# Patient Record
Sex: Male | Born: 1945 | Race: White | Hispanic: No | Marital: Single | Smoking: Never smoker
Health system: Southern US, Community
[De-identification: ages and names within clinical notes are randomized; demographics above are authoritative.]

## PROBLEM LIST (undated history)

## (undated) DIAGNOSIS — R6250 Unspecified lack of expected normal physiological development in childhood: Secondary | ICD-10-CM

## (undated) DIAGNOSIS — K219 Gastro-esophageal reflux disease without esophagitis: Secondary | ICD-10-CM

## (undated) DIAGNOSIS — E079 Disorder of thyroid, unspecified: Secondary | ICD-10-CM

## (undated) DIAGNOSIS — F79 Unspecified intellectual disabilities: Secondary | ICD-10-CM

## (undated) DIAGNOSIS — G809 Cerebral palsy, unspecified: Secondary | ICD-10-CM

## (undated) DIAGNOSIS — K2210 Ulcer of esophagus without bleeding: Secondary | ICD-10-CM

## (undated) DIAGNOSIS — K5900 Constipation, unspecified: Secondary | ICD-10-CM

## (undated) DIAGNOSIS — G8389 Other specified paralytic syndromes: Secondary | ICD-10-CM

## (undated) DIAGNOSIS — IMO0001 Reserved for inherently not codable concepts without codable children: Secondary | ICD-10-CM

## (undated) DIAGNOSIS — M419 Scoliosis, unspecified: Secondary | ICD-10-CM

## (undated) DIAGNOSIS — E7800 Pure hypercholesterolemia, unspecified: Secondary | ICD-10-CM

## (undated) DIAGNOSIS — J302 Other seasonal allergic rhinitis: Secondary | ICD-10-CM

## (undated) DIAGNOSIS — J309 Allergic rhinitis, unspecified: Secondary | ICD-10-CM

## (undated) DIAGNOSIS — Q02 Microcephaly: Secondary | ICD-10-CM

---

## 2008-09-10 ENCOUNTER — Encounter: Admission: RE | Admit: 2008-09-10 | Discharge: 2008-12-09 | Payer: Self-pay | Admitting: *Deleted

## 2009-11-14 HISTORY — PX: CYSTOSCOPY: SHX5120

## 2009-11-29 ENCOUNTER — Ambulatory Visit (HOSPITAL_COMMUNITY): Admission: RE | Admit: 2009-11-29 | Discharge: 2009-11-29 | Payer: Self-pay | Admitting: Urology

## 2010-06-30 LAB — BASIC METABOLIC PANEL
BUN: 19 mg/dL (ref 6–23)
CO2: 29 mEq/L (ref 19–32)
Calcium: 9.6 mg/dL (ref 8.4–10.5)
GFR calc non Af Amer: >60 mL/min (ref >60)
Glucose, Bld: 126 mg/dL — ABNORMAL HIGH (ref 70–99)
Sodium: 138 mEq/L (ref 135–145)

## 2010-06-30 LAB — SURGICAL PCR SCREEN: Staphylococcus aureus: NEGATIVE

## 2010-07-17 ENCOUNTER — Emergency Department (HOSPITAL_BASED_OUTPATIENT_CLINIC_OR_DEPARTMENT_OTHER)
Admission: EM | Admit: 2010-07-17 | Discharge: 2010-07-17 | Disposition: A | Discharge status: Home / Self Care | Payer: Medicare Other | Attending: Emergency Medicine | Admitting: Emergency Medicine

## 2010-07-17 DIAGNOSIS — S0180XA Unspecified open wound of other part of head, initial encounter: Secondary | ICD-10-CM | POA: Insufficient documentation

## 2010-07-17 DIAGNOSIS — F79 Unspecified intellectual disabilities: Secondary | ICD-10-CM | POA: Insufficient documentation

## 2010-07-17 DIAGNOSIS — W050XXA Fall from non-moving wheelchair, initial encounter: Secondary | ICD-10-CM | POA: Insufficient documentation

## 2011-05-31 ENCOUNTER — Encounter (HOSPITAL_BASED_OUTPATIENT_CLINIC_OR_DEPARTMENT_OTHER): Payer: Self-pay | Admitting: *Deleted

## 2011-05-31 ENCOUNTER — Emergency Department (HOSPITAL_BASED_OUTPATIENT_CLINIC_OR_DEPARTMENT_OTHER)
Admission: EM | Admit: 2011-05-31 | Discharge: 2011-05-31 | Disposition: A | Discharge status: Home / Self Care | Payer: Medicare Other | Attending: Emergency Medicine | Admitting: Emergency Medicine

## 2011-05-31 DIAGNOSIS — E079 Disorder of thyroid, unspecified: Secondary | ICD-10-CM | POA: Insufficient documentation

## 2011-05-31 DIAGNOSIS — K219 Gastro-esophageal reflux disease without esophagitis: Secondary | ICD-10-CM | POA: Insufficient documentation

## 2011-05-31 DIAGNOSIS — Z79899 Other long term (current) drug therapy: Secondary | ICD-10-CM | POA: Insufficient documentation

## 2011-05-31 DIAGNOSIS — K625 Hemorrhage of anus and rectum: Secondary | ICD-10-CM | POA: Insufficient documentation

## 2011-05-31 HISTORY — DX: Unspecified lack of expected normal physiological development in childhood: R62.50

## 2011-05-31 HISTORY — DX: Gastro-esophageal reflux disease without esophagitis: K21.9

## 2011-05-31 HISTORY — DX: Disorder of thyroid, unspecified: E07.9

## 2011-05-31 HISTORY — DX: Reserved for inherently not codable concepts without codable children: IMO0001

## 2011-05-31 LAB — CBC
HCT: 40.8 % (ref 39.0–52.0)
Hemoglobin: 13.7 g/dL (ref 13.0–17.0)
MCH: 31.4 pg (ref 26.0–34.0)
MCHC: 33.6 g/dL (ref 30.0–36.0)
MCV: 93.4 fL (ref 78.0–100.0)
Platelets: 231 10*3/uL (ref 150–400)
RBC: 4.37 MIL/uL (ref 4.22–5.81)
RDW: 13.8 % (ref 11.5–15.5)
WBC: 6.8 10*3/uL (ref 4.0–10.5)

## 2011-05-31 LAB — PROTIME-INR
INR: 0.97 (ref 0.00–1.49)
Prothrombin Time: 13.1 seconds (ref 11.6–15.2)

## 2011-05-31 LAB — OCCULT BLOOD X 1 CARD TO LAB, STOOL: Fecal Occult Bld: NEGATIVE

## 2011-05-31 LAB — APTT: aPTT: 30 seconds (ref 24–37)

## 2011-05-31 NOTE — ED Notes (Signed)
Pt to room 5 in w/c, assisted to bed from w/c, caregiver reports pt with "large amount" bright red blood in his diaper with bm on Tuesday, a small amount with bm yesterday, and a large amount again today with is bm. Caregiver states pt has denied any abd pain, no emesis per caregiver.

## 2011-05-31 NOTE — Discharge Instructions (Signed)

## 2011-06-08 NOTE — ED Provider Notes (Signed)
History    66 year-old M brought in  for evaluation rectal bleeding. Blood was noticed in his diaper was changed. This was first noted 2 days ago and again today. Pt with hx of severe MR but otherwise seems to be otherwise at his baseline. No bleeding noted from anywhere else. No hx of GI bleed that staff is aware of. No recent med changes.   CSN: [id number]  Arrival date & time 05/31/11  1127   First MD Initiated Contact with Patient 05/31/11 1159      Chief Complaint  Patient presents with  . Rectal Bleeding    (Consider location/radiation/quality/duration/timing/severity/associated sxs/prior treatment) HPI  Past Medical History  Diagnosis Date  . Reflux   . Thyroid disease   . Development delay     History reviewed. No pertinent past surgical history.  History reviewed. No pertinent family history.  History  Substance Use Topics  . Smoking status: Never Smoker   . Smokeless tobacco: Not on file  . Alcohol Use: No      Review of Systems  Level V caveat applies. Patient is unable to give useful history secondary to history of mental retardation.  Allergies  Phenothiazines  Home Medications   Current Outpatient Rx  Name Route Sig Dispense Refill  . LEVOTHYROXINE SODIUM 50 MCG PO TABS Oral Take 50 mcg by mouth daily.    Marland Kitchen LORATADINE 10 MG PO TABS Oral Take 10 mg by mouth daily.    Marland Kitchen LORAZEPAM 1 MG PO TABS Oral Take 1 mg by mouth every 8 (eight) hours.    . OMEPRAZOLE 20 MG PO CPDR Oral Take 40 mg by mouth daily.    Marland Kitchen SIMVASTATIN 20 MG PO TABS Oral Take 20 mg by mouth every evening.    . SUCRALFATE 1 G PO TABS Oral Take 1 g by mouth 3 (three) times daily.      BP 152/87  Pulse 94  Temp(Src) 99.9 F (37.7 C) (Rectal)  Resp 18  SpO2 100%  Physical Exam  Nursing note and vitals reviewed. Constitutional: He appears well-nourished. No distress.  HENT:  Head: Normocephalic and atraumatic.  Eyes: Conjunctivae are normal. Right eye exhibits no discharge.  Left eye exhibits no discharge.  Neck: Neck supple.  Cardiovascular: Normal rate, regular rhythm and normal heart sounds.  Exam reveals no gallop and no friction rub.   No murmur heard. Pulmonary/Chest: Effort normal and breath sounds normal. No respiratory distress.  Abdominal: Soft. He exhibits no distension and no mass. There is no tenderness.  Genitourinary:       Rectal examination is unremarkable. No external lesions are noted. Good rectal tone. No internal masses palpated. Soft brown stool. No gross blood noted. Hemoccult negative.  Musculoskeletal: He exhibits no edema and no tenderness.  Neurological: He is alert.       Increased tone. Gesticulating movements.  Skin: Skin is warm and dry.  Psychiatric:       Occasional yelling.    ED Course  Procedures (including critical care time)   Labs Reviewed  CBC  PROTIME-INR  APTT  OCCULT BLOOD X 1 CARD TO LAB, STOOL   No results found.   1. Rectal bleeding       MDM  66 year old male with rectal bleeding. None was noted on my exam. CBC was unremarkable. The occult blood was negative. Patient is not on a blood thinning medication. He has remained hemodynamically stable throughout his ER stay. Coags normal. Abd exam is unremarkable. Patient has  been medically clear at this point for further evaluation of his rectal bleeding as an outpatient.        Raeford Razor, MD 06/08/11 [phone]

## 2012-09-12 ENCOUNTER — Emergency Department (HOSPITAL_BASED_OUTPATIENT_CLINIC_OR_DEPARTMENT_OTHER)
Admission: EM | Admit: 2012-09-12 | Discharge: 2012-09-12 | Disposition: A | Discharge status: Home / Self Care | Payer: Medicare Other | Attending: Emergency Medicine | Admitting: Emergency Medicine

## 2012-09-12 ENCOUNTER — Encounter (HOSPITAL_BASED_OUTPATIENT_CLINIC_OR_DEPARTMENT_OTHER): Payer: Self-pay | Admitting: *Deleted

## 2012-09-12 ENCOUNTER — Emergency Department (HOSPITAL_BASED_OUTPATIENT_CLINIC_OR_DEPARTMENT_OTHER): Payer: Medicare Other

## 2012-09-12 DIAGNOSIS — N472 Paraphimosis: Secondary | ICD-10-CM

## 2012-09-12 DIAGNOSIS — Z79899 Other long term (current) drug therapy: Secondary | ICD-10-CM | POA: Insufficient documentation

## 2012-09-12 DIAGNOSIS — K219 Gastro-esophageal reflux disease without esophagitis: Secondary | ICD-10-CM | POA: Insufficient documentation

## 2012-09-12 DIAGNOSIS — N471 Phimosis: Secondary | ICD-10-CM | POA: Insufficient documentation

## 2012-09-12 DIAGNOSIS — E079 Disorder of thyroid, unspecified: Secondary | ICD-10-CM | POA: Insufficient documentation

## 2012-09-12 DIAGNOSIS — R6250 Unspecified lack of expected normal physiological development in childhood: Secondary | ICD-10-CM | POA: Insufficient documentation

## 2012-09-12 NOTE — ED Provider Notes (Signed)
History     CSN: [id number]  Arrival date & time 09/12/12  1020   First MD Initiated Contact with Patient 09/12/12 1045      Chief Complaint  Patient presents with  . Penis Pain    (Consider location/radiation/quality/duration/timing/severity/associated sxs/prior treatment) HPI Comments: Patient from group home with developmental delay presenting with penis pain and swelling for the past 2 days. Caregiver states she was given cortaid cream for irritation by his PCP. Last night and noticed he had some purple bruising to his glans and the corona area. They were not able to retract his foreskin. He still able to urinate. Denies any testicular tenderness. No abdominal pain, vomiting or fever.  The history is provided by a caregiver and the patient. The history is limited by the condition of the patient.    Past Medical History  Diagnosis Date  . Reflux   . Thyroid disease   . Development delay     History reviewed. No pertinent past surgical history.  History reviewed. No pertinent family history.  History  Substance Use Topics  . Smoking status: Never Smoker   . Smokeless tobacco: Not on file  . Alcohol Use: No      Review of Systems  Unable to perform ROS: Patient nonverbal  Genitourinary: Positive for penile pain.    Allergies  Phenothiazines  Home Medications   Current Outpatient Rx  Name  Route  Sig  Dispense  Refill  . levothyroxine (SYNTHROID, LEVOTHROID) 50 MCG tablet   Oral   Take 50 mcg by mouth daily.         Marland Kitchen loratadine (CLARITIN) 10 MG tablet   Oral   Take 10 mg by mouth daily.         Marland Kitchen LORazepam (ATIVAN) 1 MG tablet   Oral   Take 1 mg by mouth every 8 (eight) hours.         Marland Kitchen omeprazole (PRILOSEC) 20 MG capsule   Oral   Take 40 mg by mouth daily.         . simvastatin (ZOCOR) 20 MG tablet   Oral   Take 20 mg by mouth every evening.         . sucralfate (CARAFATE) 1 G tablet   Oral   Take 1 g by mouth 3 (three) times  daily.           BP 133/93  Pulse 100  Temp(Src) 98.3 F (36.8 C) (Oral)  Resp 20  SpO2 99%  Physical Exam  Constitutional: He appears well-developed and well-nourished. No distress.  HENT:  Head: Normocephalic and atraumatic.  Mouth/Throat: Oropharynx is clear and moist. No oropharyngeal exudate.  Eyes: Conjunctivae and EOM are normal. Pupils are equal, round, and reactive to light.  Neck: Normal range of motion. Neck supple.  Cardiovascular: Normal rate, regular rhythm and normal heart sounds.   No murmur heard. Pulmonary/Chest: Effort normal and breath sounds normal. No respiratory distress.  Abdominal: Soft. There is no tenderness. There is no rebound and no guarding.  Genitourinary:  Foreskin stuck in retracted position.There is mild swelling to the shaft of the penis. There is ecchymosis circumferentially over the base of the glans. Some scattered eccymosis on ventral shaft of penis Testicles are nontender. No evidence of necrotic tissue  Musculoskeletal: He exhibits no edema and no tenderness.  Neurological: He is alert. No cranial nerve deficit. He exhibits normal muscle tone. Coordination normal.  Skin: Skin is warm.    ED Course  Reduction  of dislocation Date/Time: 09/12/2012 12:35 PM Performed by: Glynn Octave Authorized by: Glynn Octave Consent: Verbal consent obtained. The procedure was performed in an emergent situation. Risks and benefits: risks, benefits and alternatives were discussed Consent given by: guardian Patient understanding: patient states understanding of the procedure being performed Patient consent: the patient's understanding of the procedure matches consent given Patient identity confirmed: arm band and provided demographic data Time out: Immediately prior to procedure a "time out" was called to verify the correct patient, procedure, equipment, support staff and site/side marked as required. Local anesthesia used: no Patient sedated:  no Patient tolerance: Patient tolerated the procedure well with no immediate complications. Comments: Successful reduction of paraphimosis   (including critical care time)  Labs Reviewed  GLUCOSE, CAPILLARY   US Scrotum  09/12/2012   *RADIOLOGY REPORT*  Clinical Data:  Testicular pain after allergic reaction and groin.  SCROTAL ULTRASOUND DOPPLER ULTRASOUND OF THE TESTICLES  Technique: Complete ultrasound examination of the testicles, epididymis, and other scrotal structures was performed.  Color and spectral Doppler ultrasound were also utilized to evaluate blood flow to the testicles.  Comparison:  None  Findings:  Right testis:  Measures 2.7 x 1.3 x 1.4 cm. The testicles heterogeneous without focal abnormality.  There is vascular flow within the right testicle. There are hypoechoic structures posterior to the right testicle. This could represent multiple cysts along the epididymis.  Left testis:  Left testicle measures 3.0 x 1.1 x 1.8 cm.  The testicle is heterogeneous without focal abnormality.  Vascular flow within the left testicle.  Right epididymis:  Normal appearance of the right epididymal head.  Left epididymis:  Normal in size and appearance.  Hydrocele:  Absent  Varicocele:  Absent  Pulsed Doppler interrogation of both testes demonstrates low resistance flow bilaterally.  IMPRESSION: Negative for testicular torsion.  Hypoechoic structures around the right testicle are indeterminate. These could represent multiple cysts.   Original Report Authenticated By: Richarda Overlie, M.D.   [place] Art/ven Flow Abd Pelv Doppler  09/12/2012   *RADIOLOGY REPORT*  Clinical Data:  Testicular pain after allergic reaction and groin.  SCROTAL ULTRASOUND DOPPLER ULTRASOUND OF THE TESTICLES  Technique: Complete ultrasound examination of the testicles, epididymis, and other scrotal structures was performed.  Color and spectral Doppler ultrasound were also utilized to evaluate blood flow to the testicles.  Comparison:  None   Findings:  Right testis:  Measures 2.7 x 1.3 x 1.4 cm. The testicles heterogeneous without focal abnormality.  There is vascular flow within the right testicle. There are hypoechoic structures posterior to the right testicle. This could represent multiple cysts along the epididymis.  Left testis:  Left testicle measures 3.0 x 1.1 x 1.8 cm.  The testicle is heterogeneous without focal abnormality.  Vascular flow within the left testicle.  Right epididymis:  Normal appearance of the right epididymal head.  Left epididymis:  Normal in size and appearance.  Hydrocele:  Absent  Varicocele:  Absent  Pulsed Doppler interrogation of both testes demonstrates low resistance flow bilaterally.  IMPRESSION: Negative for testicular torsion.  Hypoechoic structures around the right testicle are indeterminate. These could represent multiple cysts.   Original Report Authenticated By: Richarda Overlie, M.D.     1. Paraphimosis       MDM  2 days of penile pain and swelling. Abdomen is soft and nontender.  No fever or vomiting.  Paraphimosis on exam that was successfully reduced by myself. Marked improvement in ecchymosis.  Discussed with Dr. Mena Goes of urology. He  states no additional treatment necessary. The ecchymosis at the base of the glans has resolved. He states patient can followup in the urology office next week. Recommend no retraction of the foreskin for 1 week.  [place] negative for torsion. Followup with urologist next week. Do not retract foreskin for 1 week. Return precautions discussed.  Glynn Octave, MD 09/12/12 1242

## 2012-09-12 NOTE — ED Notes (Signed)
Pt to room 10 in w/c, caregiver reports pt with penis pain and swelling x 3 days. Seen by his pcp, and dx with "irritation" for which she prescribes cortaid cream. Per caregiver pt yanks and pulls at his penis frequently, although less so since irritation noticed. Penis noted intact with mild swelling. No drainage noted.

## 2012-10-02 ENCOUNTER — Encounter (HOSPITAL_COMMUNITY)
Admission: RE | Admit: 2012-10-02 | Discharge: 2012-10-02 | Disposition: A | Discharge status: Home / Self Care | Payer: Medicare Other | Source: Ambulatory Visit | Attending: Urology | Admitting: Urology

## 2012-10-02 ENCOUNTER — Encounter (HOSPITAL_COMMUNITY): Payer: Self-pay | Admitting: Pharmacy Technician

## 2012-10-02 ENCOUNTER — Encounter (HOSPITAL_COMMUNITY): Payer: Self-pay

## 2012-10-02 HISTORY — DX: Other seasonal allergic rhinitis: J30.2

## 2012-10-02 HISTORY — DX: Constipation, unspecified: K59.00

## 2012-10-02 HISTORY — DX: Gastro-esophageal reflux disease without esophagitis: K21.9

## 2012-10-02 HISTORY — DX: Pure hypercholesterolemia, unspecified: E78.00

## 2012-10-02 LAB — BASIC METABOLIC PANEL
Calcium: 9.5 mg/dL (ref 8.4–10.5)
Chloride: 102 mEq/L (ref 96–112)
Creatinine, Ser: 0.83 mg/dL (ref 0.50–1.35)
GFR calc Af Amer: >90 mL/min (ref >90)
Sodium: 140 mEq/L (ref 135–145)

## 2012-10-02 LAB — HEMOGLOBIN AND HEMATOCRIT, BLOOD
HCT: 42.8 % (ref 39.0–52.0)
Hemoglobin: 14 g/dL (ref 13.0–17.0)

## 2012-10-02 LAB — SURGICAL PCR SCREEN
MRSA, PCR: NEGATIVE
Staphylococcus aureus: NEGATIVE

## 2012-10-02 NOTE — Patient Instructions (Addendum)
    Connor Todd  10/02/2012   Your procedure is scheduled on:   10/09/2012   Report to [facility] at  615  AM.  Call this number if you have problems the morning of surgery: [phone]   Remember:   Do not eat food or drink liquids after midnight.   Take these medicines the morning of surgery with A SIP OF WATER:  Synthroid, claritin, ativan, prilosec   Do not wear jewelry, make-up or nail polish.  Do not wear lotions, powders, or perfumes.   Do not shave 48 hours prior to surgery. Men may shave face and neck.  Do not bring valuables to the hospital.  [facility] is not responsible for any belongings or valuables.  Contacts, dentures or bridgework may not be worn into surgery.  Leave suitcase in the car. After surgery it may be brought to your room.  For patients admitted to the hospital, checkout time is 11:00 AM the day of discharge.   Patients discharged the day of surgery will not be allowed to drive  home.  Name and phone number of your driver: family  Special Instructions: Shower using CHG 2 nights before surgery and the night before surgery.  If you shower the day of surgery use CHG.  Use special wash - you have one bottle of CHG for all showers.  You should use approximately 1/3 of the bottle for each shower.   Please read over the following fact sheets that you were given: Pain Booklet, Coughing and Deep Breathing, MRSA Information, Surgical Site Infection Prevention, Anesthesia Post-op Instructions and Care and Recovery After Surgery PATIENT INSTRUCTIONS POST-ANESTHESIA  IMMEDIATELY FOLLOWING SURGERY:  Do not drive or operate machinery for the first twenty four hours after surgery.  Do not make any important decisions for twenty four hours after surgery or while taking narcotic pain medications or sedatives.  If you develop intractable nausea and vomiting or a severe headache please notify your doctor immediately.  FOLLOW-UP:  Please make an appointment with your surgeon as  instructed. You do not need to follow up with anesthesia unless specifically instructed to do so.  WOUND CARE INSTRUCTIONS (if applicable):  Keep a dry clean dressing on the anesthesia/puncture wound site if there is drainage.  Once the wound has quit draining you may leave it open to air.  Generally you should leave the bandage intact for twenty four hours unless there is drainage.  If the epidural site drains for more than 36-48 hours please call the anesthesia department.  QUESTIONS?:  Please feel free to call your physician or the hospital operator if you have any questions, and they will be happy to assist you.

## 2012-10-09 ENCOUNTER — Encounter (HOSPITAL_COMMUNITY): Payer: Self-pay | Admitting: Anesthesiology

## 2012-10-09 ENCOUNTER — Ambulatory Visit (HOSPITAL_COMMUNITY)
Admission: RE | Admit: 2012-10-09 | Discharge: 2012-10-09 | Disposition: A | Discharge status: Home / Self Care | Payer: Medicare Other | Source: Ambulatory Visit | Attending: Urology | Admitting: Urology

## 2012-10-09 ENCOUNTER — Encounter (HOSPITAL_COMMUNITY)
Admission: RE | Disposition: A | Discharge status: Home / Self Care | Payer: Self-pay | Source: Ambulatory Visit | Attending: Urology

## 2012-10-09 DIAGNOSIS — Z538 Procedure and treatment not carried out for other reasons: Secondary | ICD-10-CM | POA: Insufficient documentation

## 2012-10-09 DIAGNOSIS — N471 Phimosis: Secondary | ICD-10-CM | POA: Insufficient documentation

## 2012-10-09 DIAGNOSIS — R6250 Unspecified lack of expected normal physiological development in childhood: Secondary | ICD-10-CM | POA: Insufficient documentation

## 2012-10-09 DIAGNOSIS — G809 Cerebral palsy, unspecified: Secondary | ICD-10-CM | POA: Insufficient documentation

## 2012-10-09 DIAGNOSIS — N478 Other disorders of prepuce: Secondary | ICD-10-CM | POA: Insufficient documentation

## 2012-10-09 SURGERY — CANCELLED PROCEDURE

## 2012-10-09 SURGICAL SUPPLY — 22 items
BANDAGE CONFORM 2  STR LF (GAUZE/BANDAGES/DRESSINGS) ×2 IMPLANT
CLOTH BEACON ORANGE TIMEOUT ST (SAFETY) ×2 IMPLANT
COVER LIGHT HANDLE STERIS (MISCELLANEOUS) ×4 IMPLANT
DECANTER SPIKE VIAL GLASS SM (MISCELLANEOUS) ×2 IMPLANT
ELECT NEEDLE TIP 2.8 STRL (NEEDLE) IMPLANT
FORMALIN 10 PREFIL 120ML (MISCELLANEOUS) ×2 IMPLANT
GAUZE PETROLATUM 1 X8 (GAUZE/BANDAGES/DRESSINGS) ×2 IMPLANT
GLOVE BIO SURGEON STRL SZ7 (GLOVE) ×2 IMPLANT
GOWN STRL REIN XL XLG (GOWN DISPOSABLE) ×4 IMPLANT
KIT ROOM TURNOVER AP CYSTO (KITS) ×2 IMPLANT
MANIFOLD NEPTUNE II (INSTRUMENTS) ×2 IMPLANT
NEEDLE HYPO 25X1 1.5 SAFETY (NEEDLE) ×2 IMPLANT
NS IRRIG 1000ML POUR BTL (IV SOLUTION) ×2 IMPLANT
PACK MINOR (CUSTOM PROCEDURE TRAY) ×2 IMPLANT
PAD ARMBOARD 7.5X6 YLW CONV (MISCELLANEOUS) ×2 IMPLANT
SET BASIN LINEN APH (SET/KITS/TRAYS/PACK) ×2 IMPLANT
SET IRRIGATING DISP (SET/KITS/TRAYS/PACK) ×2 IMPLANT
SOL PREP PROV IODINE SCRUB 4OZ (MISCELLANEOUS) ×2 IMPLANT
SUT CHROMIC 3 0 SH 27 (SUTURE) ×2 IMPLANT
SUT VICRYL AB 3 0 TIES (SUTURE) ×2 IMPLANT
SYR CONTROL 10ML LL (SYRINGE) ×2 IMPLANT
TOWEL OR 17X26 4PK STRL BLUE (TOWEL DISPOSABLE) ×2 IMPLANT

## 2012-10-09 NOTE — Progress Notes (Signed)
Attempted to contact case worker 2 times. Only got answering machine left message. Notified dr Jerre Simon ordered to cancel surgery and have patient care givers to call his office and reschedule.

## 2012-10-09 NOTE — H&P (Signed)
NAMEEAGLE, PITTA              ACCOUNT NO.:  [id number]  MEDICAL RECORD NO.:  [id number]  LOCATION:  PERIO                         FACILITY:  [facility]  PHYSICIAN:  Ky Barban, M.D.DATE OF BIRTH:  [birth year]  DATE OF ADMISSION:  10/09/2012 DATE OF DISCHARGE:  LH                             HISTORY & PHYSICAL   CHIEF COMPLAINT:  Phimosis.  HISTORY:  A 67 year old gentleman who is mentally retarded, is brought by the attendants having problem with the foreskin which is tight, and he is being brought as an outpatient to undergo circumcision under anesthesia as an outpatient.  I have explained the procedure to the attendant and its complications.  The patient has cerebral palsy, he probably does not understand everything I told him.  In 2011, he had a complete workup done for possible BPH but my assessment came back as that he does not have any obstruction.  So, he is doing fine.  He is coming as an outpatient, will undergo circumcision as an outpatient.  He had a PSA done in 2011, at that time, it was 2.9.  PHYSICAL EXAMINATION:  GENERAL:  Moderately built male, fully conscious, alert, oriented, and not in acute distress. VITAL SIGNS:  His blood pressure is. 130/80, temperature is normal. CENTRAL NERVOUS SYSTEM:  No gross neurological deficit. HEAD, NECK, EYES, ENT:  Negative. CHEST:  Symmetrical, normal breath sounds. HEART:  Regular sinus rhythm.  No murmur. ABDOMEN:  Soft, flat.  Liver, spleen, kidneys are not palpable. EXTERNAL GENITALIA:  He has phimosis, testicles are normal. RECTAL:  Deferred. EXTREMITIES:  Normal.  IMPRESSION:  Phimosis.  PLAN:  Circumcision under anesthesia as an outpatient.  Note, the patient had cysto under anesthesia by me done in November 28, 2009.     Ky Barban, M.D.    MIJ/MEDQ  D:  10/08/2012  T:  10/09/2012  Job:  [id number]

## 2012-10-09 NOTE — Progress Notes (Signed)
Waiting to call social services at 8:00. To see ifany one can sign patients consent.dr Jerre Simon aware said to cancel sugery  If cannot get consent and to call him back and let him know.

## 2012-11-12 ENCOUNTER — Encounter (HOSPITAL_COMMUNITY): Payer: Self-pay

## 2012-11-12 ENCOUNTER — Encounter (HOSPITAL_COMMUNITY)
Admission: RE | Admit: 2012-11-12 | Discharge: 2012-11-12 | Disposition: A | Discharge status: Home / Self Care | Payer: Medicare Other | Source: Ambulatory Visit | Attending: Urology | Admitting: Urology

## 2012-11-12 ENCOUNTER — Encounter (HOSPITAL_COMMUNITY): Payer: Self-pay | Admitting: Pharmacy Technician

## 2012-11-12 HISTORY — DX: Cerebral palsy, unspecified: G80.9

## 2012-11-12 HISTORY — DX: Unspecified intellectual disabilities: F79

## 2012-11-12 NOTE — Patient Instructions (Addendum)
    ECHO ALLSBROOK  11/12/2012   Your procedure is scheduled on:  11/13/2012  Report to [facility] at  715  AM.  Call this number if you have problems the morning of surgery: [phone]   Remember:   Do not eat food or drink liquids after midnight.   Take these medicines the morning of surgery with A SIP OF WATER: ativan, synthroid, claritin,   Do not wear jewelry, make-up or nail polish.  Do not wear lotions, powders, or perfumes.   Do not shave 48 hours prior to surgery. Men may shave face and neck.  Do not bring valuables to the hospital.  [facility] is not responsible for any belongings or valuables.  Contacts, dentures or bridgework may not be worn into surgery.  Leave suitcase in the car. After surgery it may be brought to your room.  For patients admitted to the hospital, checkout time is 11:00 AM the day of discharge.   Patients discharged the day of surgery will not be allowed to drive home.  Name and phone number of your driver: family  Special Instructions: Shower using CHG 2 nights before surgery and the night before surgery.  If you shower the day of surgery use CHG.  Use special wash - you have one bottle of CHG for all showers.  You should use approximately 1/3 of the bottle for each shower.   Please read over the following fact sheets that you were given: Pain Booklet, Coughing and Deep Breathing, Surgical Site Infection Prevention, Anesthesia Post-op Instructions and Care and Recovery After Surgery PATIENT INSTRUCTIONS POST-ANESTHESIA  IMMEDIATELY FOLLOWING SURGERY:  Do not drive or operate machinery for the first twenty four hours after surgery.  Do not make any important decisions for twenty four hours after surgery or while taking narcotic pain medications or sedatives.  If you develop intractable nausea and vomiting or a severe headache please notify your doctor immediately.  FOLLOW-UP:  Please make an appointment with your surgeon as instructed. You do not need to  follow up with anesthesia unless specifically instructed to do so.  WOUND CARE INSTRUCTIONS (if applicable):  Keep a dry clean dressing on the anesthesia/puncture wound site if there is drainage.  Once the wound has quit draining you may leave it open to air.  Generally you should leave the bandage intact for twenty four hours unless there is drainage.  If the epidural site drains for more than 36-48 hours please call the anesthesia department.  QUESTIONS?:  Please feel free to call your physician or the hospital operator if you have any questions, and they will be happy to assist you.

## 2012-11-12 NOTE — OR Nursing (Signed)
Called office spoke with Elease Hashimoto , she is to let Dr. Jerre Simon know that we need an updated H&P. The one in the chl system was done 10-09-2012. This is greater than 30 days.

## 2012-11-13 ENCOUNTER — Ambulatory Visit (HOSPITAL_COMMUNITY): Payer: Medicare Other | Admitting: Anesthesiology

## 2012-11-13 ENCOUNTER — Ambulatory Visit (HOSPITAL_COMMUNITY)
Admission: RE | Admit: 2012-11-13 | Discharge: 2012-11-13 | Disposition: A | Discharge status: Home / Self Care | Payer: Medicare Other | Source: Ambulatory Visit | Attending: Urology | Admitting: Urology

## 2012-11-13 ENCOUNTER — Encounter (HOSPITAL_COMMUNITY)
Admission: RE | Disposition: A | Discharge status: Home / Self Care | Payer: Self-pay | Source: Ambulatory Visit | Attending: Urology

## 2012-11-13 ENCOUNTER — Encounter (HOSPITAL_COMMUNITY): Payer: Self-pay | Admitting: Anesthesiology

## 2012-11-13 ENCOUNTER — Encounter (HOSPITAL_COMMUNITY): Payer: Self-pay | Admitting: *Deleted

## 2012-11-13 DIAGNOSIS — F79 Unspecified intellectual disabilities: Secondary | ICD-10-CM | POA: Insufficient documentation

## 2012-11-13 DIAGNOSIS — N471 Phimosis: Secondary | ICD-10-CM | POA: Insufficient documentation

## 2012-11-13 DIAGNOSIS — N478 Other disorders of prepuce: Secondary | ICD-10-CM | POA: Insufficient documentation

## 2012-11-13 DIAGNOSIS — Z79899 Other long term (current) drug therapy: Secondary | ICD-10-CM | POA: Insufficient documentation

## 2012-11-13 DIAGNOSIS — K219 Gastro-esophageal reflux disease without esophagitis: Secondary | ICD-10-CM | POA: Insufficient documentation

## 2012-11-13 DIAGNOSIS — G809 Cerebral palsy, unspecified: Secondary | ICD-10-CM | POA: Insufficient documentation

## 2012-11-13 HISTORY — PX: CIRCUMCISION: SHX1350

## 2012-11-13 SURGERY — CIRCUMCISION, ADULT
Anesthesia: General | Site: Penis | Wound class: Clean Contaminated

## 2012-11-13 MED ORDER — BUPIVACAINE HCL (PF) 0.5 % IJ SOLN
INTRAMUSCULAR | Status: AC
  Filled 2012-11-13: qty 30

## 2012-11-13 MED ORDER — ONDANSETRON HCL 4 MG/2ML IJ SOLN
4.0000 mg | Freq: Once | INTRAMUSCULAR | Status: AC
  Administered 2012-11-13: 4 mg via INTRAVENOUS

## 2012-11-13 MED ORDER — EPHEDRINE SULFATE 50 MG/ML IJ SOLN
INTRAMUSCULAR | Status: AC
  Filled 2012-11-13: qty 1

## 2012-11-13 MED ORDER — MIDAZOLAM HCL 2 MG/2ML IJ SOLN
INTRAMUSCULAR | Status: AC
  Filled 2012-11-13: qty 2

## 2012-11-13 MED ORDER — FENTANYL CITRATE 0.05 MG/ML IJ SOLN
INTRAMUSCULAR | Status: DC | PRN
  Administered 2012-11-13 (×2): 25 ug via INTRAVENOUS

## 2012-11-13 MED ORDER — MIDAZOLAM HCL 5 MG/5ML IJ SOLN
1.0000 mg | INTRAMUSCULAR | Status: AC
  Administered 2012-11-13 (×2): via INTRAVENOUS

## 2012-11-13 MED ORDER — LIDOCAINE HCL (PF) 1 % IJ SOLN
INTRAMUSCULAR | Status: AC
  Filled 2012-11-13: qty 5

## 2012-11-13 MED ORDER — EPHEDRINE SULFATE 50 MG/ML IJ SOLN
INTRAMUSCULAR | Status: DC | PRN
  Administered 2012-11-13: 5 mg via INTRAVENOUS

## 2012-11-13 MED ORDER — BUPIVACAINE HCL (PF) 0.5 % IJ SOLN
INTRAMUSCULAR | Status: DC | PRN
  Administered 2012-11-13: 20 mL

## 2012-11-13 MED ORDER — LACTATED RINGERS IV SOLN
INTRAVENOUS | Status: DC
  Administered 2012-11-13: 1000 mL via INTRAVENOUS

## 2012-11-13 MED ORDER — OXYCODONE-ACETAMINOPHEN 7.5-325 MG PO TABS: 1.0000 | ORAL_TABLET | Freq: Four times a day (QID) | ORAL | Status: DC | PRN

## 2012-11-13 MED ORDER — LIDOCAINE HCL (CARDIAC) 20 MG/ML IV SOLN
INTRAVENOUS | Status: DC | PRN
  Administered 2012-11-13: 30 mg via INTRAVENOUS

## 2012-11-13 MED ORDER — MIDAZOLAM HCL 2 MG/2ML IJ SOLN
1.0000 mg | INTRAMUSCULAR | Status: DC | PRN
  Administered 2012-11-13 (×2): 2 mg via INTRAVENOUS

## 2012-11-13 MED ORDER — FENTANYL CITRATE 0.05 MG/ML IJ SOLN
25.0000 ug | INTRAMUSCULAR | Status: AC
  Administered 2012-11-13 (×2): 25 ug via INTRAVENOUS

## 2012-11-13 MED ORDER — FENTANYL CITRATE 0.05 MG/ML IJ SOLN
INTRAMUSCULAR | Status: AC
  Filled 2012-11-13: qty 2

## 2012-11-13 MED ORDER — PROPOFOL 10 MG/ML IV EMUL
INTRAVENOUS | Status: AC
  Filled 2012-11-13: qty 20

## 2012-11-13 MED ORDER — FENTANYL CITRATE 0.05 MG/ML IJ SOLN
INTRAMUSCULAR | Status: AC
  Filled 2012-11-13: qty 5

## 2012-11-13 MED ORDER — ONDANSETRON HCL 4 MG/2ML IJ SOLN: 4.0000 mg | Freq: Once | INTRAMUSCULAR | Status: DC | PRN

## 2012-11-13 MED ORDER — FENTANYL CITRATE 0.05 MG/ML IJ SOLN: 25.0000 ug | INTRAMUSCULAR | Status: DC | PRN

## 2012-11-13 MED ORDER — ONDANSETRON HCL 4 MG/2ML IJ SOLN
INTRAMUSCULAR | Status: AC
  Filled 2012-11-13: qty 2

## 2012-11-13 MED ORDER — SODIUM CHLORIDE 0.9 % IR SOLN
Status: DC | PRN
  Administered 2012-11-13: 1000 mL

## 2012-11-13 MED ORDER — PROPOFOL 10 MG/ML IV BOLUS
INTRAVENOUS | Status: DC | PRN
  Administered 2012-11-13: 130 mg via INTRAVENOUS

## 2012-11-13 SURGICAL SUPPLY — 25 items
BANDAGE CONFORM 2  STR LF (GAUZE/BANDAGES/DRESSINGS) ×2 IMPLANT
CLOTH BEACON ORANGE TIMEOUT ST (SAFETY) ×2 IMPLANT
COVER LIGHT HANDLE STERIS (MISCELLANEOUS) ×4 IMPLANT
DECANTER SPIKE VIAL GLASS SM (MISCELLANEOUS) ×2 IMPLANT
FORMALIN 10 PREFIL 120ML (MISCELLANEOUS) ×2 IMPLANT
GAUZE PETROLATUM 1 X8 (GAUZE/BANDAGES/DRESSINGS) ×2 IMPLANT
GLOVE BIO SURGEON STRL SZ7 (GLOVE) ×2 IMPLANT
GLOVE BIOGEL PI IND STRL 7.0 (GLOVE) ×2 IMPLANT
GLOVE BIOGEL PI INDICATOR 7.0 (GLOVE) ×2
GLOVE ECLIPSE 6.5 STRL STRAW (GLOVE) ×2 IMPLANT
GLOVE EXAM NITRILE LRG STRL (GLOVE) ×2 IMPLANT
GOWN STRL REIN XL XLG (GOWN DISPOSABLE) ×6 IMPLANT
KIT ROOM TURNOVER AP CYSTO (KITS) ×2 IMPLANT
MANIFOLD NEPTUNE II (INSTRUMENTS) ×2 IMPLANT
NEEDLE HYPO 25X1 1.5 SAFETY (NEEDLE) ×2 IMPLANT
NS IRRIG 1000ML POUR BTL (IV SOLUTION) ×2 IMPLANT
PACK MINOR (CUSTOM PROCEDURE TRAY) ×2 IMPLANT
PAD ARMBOARD 7.5X6 YLW CONV (MISCELLANEOUS) ×2 IMPLANT
SET BASIN LINEN APH (SET/KITS/TRAYS/PACK) ×2 IMPLANT
SOL PREP PROV IODINE SCRUB 4OZ (MISCELLANEOUS) ×2 IMPLANT
SPONGE LAP 18X18 X RAY DECT (DISPOSABLE) ×2 IMPLANT
SUT CHROMIC 3 0 SH 27 (SUTURE) ×6 IMPLANT
SYR CONTROL 10ML LL (SYRINGE) ×2 IMPLANT
TAPE SURG TRANSPORE 1 IN (GAUZE/BANDAGES/DRESSINGS) ×1 IMPLANT
TAPE SURGICAL TRANSPORE 1 IN (GAUZE/BANDAGES/DRESSINGS) ×1

## 2012-11-13 NOTE — H&P (Deleted)
NAME:  Connor Todd, Connor Todd NO.:  [id number]  MEDICAL RECORD NO.:  [id number]  LOCATION:  DOIB                          FACILITY:  [facility]  PHYSICIAN:  Ky Barban, M.D.DATE OF BIRTH:  January 27, 1946  DATE OF ADMISSION:  11/12/2012 DATE OF DISCHARGE:  07/30/2014LH                             HISTORY & PHYSICAL   HISTORY:  A 67 year old gentleman who is mentally retarded, who was brought by the attending and having problem with the foreskin which is tight and is being brought as an outpatient to undergo circumcision under anesthesia as an outpatient.  I have explained the procedure limitation, complication to the attendant, he understands and wants me to go ahead and proceed.  The patient has cerebral palsy, probably does he not understand anything that I told him.  In 2011, he had complete workup done for possible BPH, but my assessment came back as negative. He does not have any obstruction.  He will undergo circumcision under anesthesia as an outpatient.  His PSA which was done in 2011, was 2.9.  PHYSICAL EXAMINATION:  GENERAL:  Moderately built male, not in acute distress, fully conscious, alert, oriented. VITAL SIGNS:  Blood pressure is 130/80, temperature is 98.6. CENTRAL NERVOUS SYSTEM:  No gross neurological deficits. HEAD, NECK, EYES, ENT:  Negative. CHEST:  Symmetrical.  Normal breath sounds. HEART:  Regular sinus rhythm.  No murmur. ABDOMEN:  Soft, flat.  Liver, spleen, and kidneys are not palpable.  No CVA tenderness. GU: External genitalia, he has phimosis, cannot retract his foreskin. Testicles are normal. RECTAL:  Deferred. EXTREMITIES:  Normal.  IMPRESSION:  Phimosis.  PLAN:  Circumcision under anesthesia as an outpatient.     Ky Barban, M.D.     MIJ/MEDQ  D:  11/12/2012  T:  11/13/2012  Job:  [id number]

## 2012-11-13 NOTE — Brief Op Note (Signed)
11/13/2012  10:21 AM  PATIENT:  Connor Todd  67 y.o. male  PRE-OPERATIVE DIAGNOSIS:  phimosis  POST-OPERATIVE DIAGNOSIS:  phimosis  PROCEDURE:  Procedure(s): CIRCUMCISION ADULT (N/A)  SURGEON:  Surgeon(s) and Role:    * Ky Barban, MD - Primary  PHYSICIAN ASSISTANT:   ASSISTANTS: none   ANESTHESIA:   general  EBL:  Total I/O In: 700 [I.V.:700] Out: 0   BLOOD ADMINISTERED:none  DRAINS: none   LOCAL MEDICATIONS USED:  MARCAINE     SPECIMEN:  Source of Specimen:  foreskin  DISPOSITION OF SPECIMEN:  PATHOLOGY  COUNTS:  YES  TOURNIQUET:  * No tourniquets in log *  DICTATION: .Other Dictation: Dictation Number dictation [id number]  PLAN OF CARE: Discharge to home after PACU  PATIENT DISPOSITION:  PACU - hemodynamically stable.   Delay start of Pharmacological VTE agent (>24hrs) due to surgical blood loss or risk of bleeding:

## 2012-11-13 NOTE — H&P (Signed)
NAME:  Connor Todd, Connor Todd              ACCOUNT NO.:  [id number]  MEDICAL RECORD NO.:  [id number]  LOCATION:  DOIB                          FACILITY:  [facility]  PHYSICIAN:  Mohammad I. Irini Leet, M.D.DATE OF BIRTH:  07/01/1945  DATE OF ADMISSION:  11/12/2012 DATE OF DISCHARGE:  07/30/2014LH                             HISTORY & PHYSICAL   HISTORY:  A 67-year-old gentleman who is mentally retarded, who was brought by the attending and having problem with the foreskin which is tight and is being brought as an outpatient to undergo circumcision under anesthesia as an outpatient.  I have explained the procedure limitation, complication to the attendant, he understands and wants me to go ahead and proceed.  The patient has cerebral palsy, probably does he not understand anything that I told him.  In 2011, he had complete workup done for possible BPH, but my assessment came back as negative. He does not have any obstruction.  He will undergo circumcision under anesthesia as an outpatient.  His PSA which was done in 2011, was 2.9.  PHYSICAL EXAMINATION:  GENERAL:  Moderately built male, not in acute distress, fully conscious, alert, oriented. VITAL SIGNS:  Blood pressure is 130/80, temperature is 98.6. CENTRAL NERVOUS SYSTEM:  No gross neurological deficits. HEAD, NECK, EYES, ENT:  Negative. CHEST:  Symmetrical.  Normal breath sounds. HEART:  Regular sinus rhythm.  No murmur. ABDOMEN:  Soft, flat.  Liver, spleen, and kidneys are not palpable.  No CVA tenderness. GU: External genitalia, he has phimosis, cannot retract his foreskin. Testicles are normal. RECTAL:  Deferred. EXTREMITIES:  Normal.  IMPRESSION:  Phimosis.  PLAN:  Circumcision under anesthesia as an outpatient.     Mohammad I. Mathhew Buysse, M.D.     MIJ/MEDQ  D:  11/12/2012  T:  11/13/2012  Job:  [id number] 

## 2012-11-13 NOTE — Anesthesia Postprocedure Evaluation (Signed)
  Anesthesia Post-op Note  Patient: Connor Todd  Procedure(s) Performed: Procedure(s): CIRCUMCISION ADULT (N/A)  Patient Location: PACU  Anesthesia Type:General  Level of Consciousness: sedated  Airway and Oxygen Therapy: Patient Spontanous Breathing and Patient connected to face mask oxygen  Post-op Pain: none  Post-op Assessment: Post-op Vital signs reviewed, Patient's Cardiovascular Status Stable, Respiratory Function Stable, Patent Airway and No signs of Nausea or vomiting  Post-op Vital Signs: Reviewed and stable  Complications: No apparent anesthesia complications

## 2012-11-13 NOTE — Op Note (Signed)
Connor Todd, Connor Todd              ACCOUNT NO.:  [id number]  MEDICAL RECORD NO.:  [id number]  LOCATION:  [facility]                          FACILITY:  [facility]  PHYSICIAN:  Ky Barban, M.D.DATE OF BIRTH:  [birth year]  DATE OF PROCEDURE: DATE OF DISCHARGE:  11/13/2012                              OPERATIVE REPORT   PREOPERATIVE DIAGNOSIS:  Phimosis.  POSTOPERATIVE DIAGNOSIS:  Phimosis.  PROCEDURE:  Circumcision.  ANESTHESIA:  General.  PROCEDURE IN DETAIL:  The patient under general endotracheal anesthesia after usual prep and drape, dorsal slit was made.  Glans penis prepped with Betadine, and then the redundant prepuce was circumferentially excised leaving about 5 mm mucosa.  Bleeders were thoroughly coagulated. After making sure there was complete hemostasis, skin and mucosa was closed together with interrupted sutures of 3-0 chromic, and at the end, the base of the penis infiltrated with 20 mL of 1% Marcaine and sterile dressing applied to the wound after wrapping it with Vaseline gauze.  At this point, the patient left the operating room in satisfactory condition.     Ky Barban, M.D.     MIJ/MEDQ  D:  11/13/2012  T:  11/13/2012  Job:  [id number]

## 2012-11-13 NOTE — Anesthesia Preprocedure Evaluation (Signed)
Anesthesia Evaluation  Patient identified by MRN, date of birth, ID band Patient confused    Reviewed: Allergy & Precautions, H&P , NPO status , Patient's Chart, lab work & pertinent test results  Airway Mallampati: II TM Distance: >3 FB     Dental  (+) Poor Dentition, Missing and Chipped   Pulmonary neg pulmonary ROS,  breath sounds clear to auscultation        Cardiovascular negative cardio ROS  Rhythm:Regular Rate:Normal     Neuro/Psych PSYCHIATRIC DISORDERS (cerebral palsy, mental retardation.)    GI/Hepatic GERD-  Medicated and Controlled,  Endo/Other    Renal/GU      Musculoskeletal   Abdominal   Peds  Hematology   Anesthesia Other Findings   Reproductive/Obstetrics                           Anesthesia Physical Anesthesia Plan  ASA: III  Anesthesia Plan: General   Post-op Pain Management:    Induction: Intravenous  Airway Management Planned: LMA  Additional Equipment:   Intra-op Plan:   Post-operative Plan: Extubation in OR  Informed Consent: I have reviewed the patients History and Physical, chart, labs and discussed the procedure including the risks, benefits and alternatives for the proposed anesthesia with the patient or authorized representative who has indicated his/her understanding and acceptance.     Plan Discussed with:   Anesthesia Plan Comments:         Anesthesia Quick Evaluation

## 2012-11-13 NOTE — Transfer of Care (Signed)
Immediate Anesthesia Transfer of Care Note  Patient: Connor Todd  Procedure(s) Performed: Procedure(s): CIRCUMCISION ADULT (N/A)  Patient Location: PACU  Anesthesia Type:General  Level of Consciousness: sedated  Airway & Oxygen Therapy: Patient Spontanous Breathing and Patient connected to face mask oxygen  Post-op Assessment: Report given to PACU RN and Post -op Vital signs reviewed and stable  Post vital signs: Reviewed and stable  Complications: No apparent anesthesia complications

## 2012-11-13 NOTE — Anesthesia Procedure Notes (Addendum)
Procedure Name: LMA Insertion Date/Time: 11/13/2012 9:35 AM Performed by: Glynn Octave E Pre-anesthesia Checklist: Patient identified, Patient being monitored, Emergency Drugs available, Timeout performed and Suction available Patient Re-evaluated:Patient Re-evaluated prior to inductionOxygen Delivery Method: Circle System Utilized Preoxygenation: Pre-oxygenation with 100% oxygen Intubation Type: IV induction Ventilation: Mask ventilation without difficulty LMA: LMA inserted LMA Size: 4.0 Number of attempts: 1 Placement Confirmation: positive ETCO2 and breath sounds checked- equal and bilateral

## 2012-11-13 NOTE — Progress Notes (Signed)
No change on reexamination no change in H&P.

## 2012-11-14 ENCOUNTER — Encounter (HOSPITAL_COMMUNITY): Payer: Self-pay | Admitting: Urology

## 2013-06-27 ENCOUNTER — Emergency Department (HOSPITAL_COMMUNITY): Payer: Medicare Other

## 2013-06-27 ENCOUNTER — Encounter (HOSPITAL_COMMUNITY): Payer: Self-pay | Admitting: Emergency Medicine

## 2013-06-27 ENCOUNTER — Inpatient Hospital Stay (HOSPITAL_COMMUNITY)
Admission: EM | Admit: 2013-06-27 | Discharge: 2013-07-01 | DRG: 603 | Disposition: A | Discharge status: Home / Self Care | Payer: Medicare Other | Attending: Internal Medicine | Admitting: Internal Medicine

## 2013-06-27 DIAGNOSIS — E785 Hyperlipidemia, unspecified: Secondary | ICD-10-CM | POA: Diagnosis present

## 2013-06-27 DIAGNOSIS — G809 Cerebral palsy, unspecified: Secondary | ICD-10-CM | POA: Diagnosis present

## 2013-06-27 DIAGNOSIS — Z79899 Other long term (current) drug therapy: Secondary | ICD-10-CM

## 2013-06-27 DIAGNOSIS — E039 Hypothyroidism, unspecified: Secondary | ICD-10-CM | POA: Diagnosis present

## 2013-06-27 DIAGNOSIS — Z993 Dependence on wheelchair: Secondary | ICD-10-CM

## 2013-06-27 DIAGNOSIS — F79 Unspecified intellectual disabilities: Secondary | ICD-10-CM | POA: Diagnosis present

## 2013-06-27 DIAGNOSIS — L03317 Cellulitis of buttock: Principal | ICD-10-CM

## 2013-06-27 DIAGNOSIS — L0231 Cutaneous abscess of buttock: Principal | ICD-10-CM | POA: Diagnosis present

## 2013-06-27 DIAGNOSIS — K219 Gastro-esophageal reflux disease without esophagitis: Secondary | ICD-10-CM | POA: Diagnosis present

## 2013-06-27 DIAGNOSIS — E876 Hypokalemia: Secondary | ICD-10-CM | POA: Diagnosis present

## 2013-06-27 LAB — CBC WITH DIFFERENTIAL/PLATELET
BASOS ABS: 0 10*3/uL (ref 0.0–0.1)
Basophils Relative: 0 % (ref 0–1)
Eosinophils Absolute: 0 10*3/uL (ref 0.0–0.7)
Eosinophils Relative: 0 % (ref 0–5)
HCT: 38.8 % — ABNORMAL LOW (ref 39.0–52.0)
Hemoglobin: 13 g/dL (ref 13.0–17.0)
LYMPHS ABS: 0.6 10*3/uL — AB (ref 0.7–4.0)
LYMPHS PCT: 6 % — AB (ref 12–46)
MCH: 31.5 pg (ref 26.0–34.0)
MCHC: 33.5 g/dL (ref 30.0–36.0)
MCV: 93.9 fL (ref 78.0–100.0)
Monocytes Absolute: 1.5 10*3/uL — ABNORMAL HIGH (ref 0.1–1.0)
Monocytes Relative: 13 % — ABNORMAL HIGH (ref 3–12)
NEUTROS ABS: 8.8 10*3/uL — AB (ref 1.7–7.7)
NEUTROS PCT: 81 % — AB (ref 43–77)
PLATELETS: 256 10*3/uL (ref 150–400)
RBC: 4.13 MIL/uL — AB (ref 4.22–5.81)
RDW: 13.8 % (ref 11.5–15.5)
WBC: 10.9 10*3/uL — AB (ref 4.0–10.5)

## 2013-06-27 LAB — CBC
HCT: 36.3 % — ABNORMAL LOW (ref 39.0–52.0)
Hemoglobin: 12.3 g/dL — ABNORMAL LOW (ref 13.0–17.0)
MCH: 31.8 pg (ref 26.0–34.0)
MCHC: 33.9 g/dL (ref 30.0–36.0)
MCV: 93.8 fL (ref 78.0–100.0)
PLATELETS: 237 10*3/uL (ref 150–400)
RBC: 3.87 MIL/uL — ABNORMAL LOW (ref 4.22–5.81)
RDW: 13.9 % (ref 11.5–15.5)
WBC: 8.9 10*3/uL (ref 4.0–10.5)

## 2013-06-27 LAB — BASIC METABOLIC PANEL
BUN: 22 mg/dL (ref 6–23)
CALCIUM: 9.7 mg/dL (ref 8.4–10.5)
CO2: 27 meq/L (ref 19–32)
Chloride: 104 mEq/L (ref 96–112)
Creatinine, Ser: 0.75 mg/dL (ref 0.50–1.35)
GFR calc Af Amer: >90 mL/min (ref >90)
GLUCOSE: 130 mg/dL — AB (ref 70–99)
POTASSIUM: 4.3 meq/L (ref 3.7–5.3)
SODIUM: 144 meq/L (ref 137–147)

## 2013-06-27 LAB — TSH: TSH: 1.309 u[IU]/mL (ref 0.350–4.500)

## 2013-06-27 LAB — CREATININE, SERUM
CREATININE: 0.65 mg/dL (ref 0.50–1.35)
GFR calc Af Amer: >90 mL/min (ref >90)
GFR calc non Af Amer: >90 mL/min (ref >90)

## 2013-06-27 LAB — LACTIC ACID, PLASMA: LACTIC ACID, VENOUS: 1 mmol/L (ref 0.5–2.2)

## 2013-06-27 LAB — MRSA PCR SCREENING: MRSA by PCR: POSITIVE — AB

## 2013-06-27 MED ORDER — SUCRALFATE 1 G PO TABS
1.0000 g | ORAL_TABLET | Freq: Three times a day (TID) | ORAL | Status: DC
  Administered 2013-06-27 – 2013-07-01 (×17): 1 g via ORAL
  Filled 2013-06-27 (×23): qty 1

## 2013-06-27 MED ORDER — PIPERACILLIN-TAZOBACTAM 3.375 G IVPB
3.3750 g | Freq: Three times a day (TID) | INTRAVENOUS | Status: DC
  Administered 2013-06-27 – 2013-06-29 (×6): 3.375 g via INTRAVENOUS
  Filled 2013-06-27 (×10): qty 50

## 2013-06-27 MED ORDER — LORAZEPAM 0.5 MG PO TABS
0.5000 mg | ORAL_TABLET | Freq: Two times a day (BID) | ORAL | Status: DC | PRN
  Administered 2013-06-28: 0.5 mg via ORAL
  Administered 2013-06-28: 1 mg via ORAL
  Administered 2013-06-29 – 2013-06-30 (×2): 0.5 mg via ORAL
  Filled 2013-06-27 (×2): qty 1
  Filled 2013-06-27 (×2): qty 2

## 2013-06-27 MED ORDER — LEVOTHYROXINE SODIUM 50 MCG PO TABS
50.0000 ug | ORAL_TABLET | Freq: Every day | ORAL | Status: DC
  Administered 2013-06-28 – 2013-07-01 (×4): 50 ug via ORAL
  Filled 2013-06-27 (×6): qty 1

## 2013-06-27 MED ORDER — VANCOMYCIN HCL 10 G IV SOLR
1500.0000 mg | INTRAVENOUS | Status: DC
  Administered 2013-06-27 – 2013-06-28 (×2): 1500 mg via INTRAVENOUS
  Filled 2013-06-27 (×4): qty 1500

## 2013-06-27 MED ORDER — MORPHINE SULFATE 2 MG/ML IJ SOLN: 2.0000 mg | INTRAMUSCULAR | Status: DC | PRN

## 2013-06-27 MED ORDER — ACETAMINOPHEN 325 MG PO TABS: 650.0000 mg | ORAL_TABLET | Freq: Four times a day (QID) | ORAL | Status: DC | PRN

## 2013-06-27 MED ORDER — SODIUM CHLORIDE 0.9 % IV SOLN
INTRAVENOUS | Status: DC
  Administered 2013-06-27 – 2013-06-28 (×2): via INTRAVENOUS

## 2013-06-27 MED ORDER — SIMVASTATIN 20 MG PO TABS
20.0000 mg | ORAL_TABLET | Freq: Every evening | ORAL | Status: DC
  Administered 2013-06-27 – 2013-06-30 (×4): 20 mg via ORAL
  Filled 2013-06-27 (×5): qty 1

## 2013-06-27 MED ORDER — CLINDAMYCIN PHOSPHATE 600 MG/50ML IV SOLN
600.0000 mg | Freq: Once | INTRAVENOUS | Status: AC
  Administered 2013-06-27: 600 mg via INTRAVENOUS
  Filled 2013-06-27: qty 50

## 2013-06-27 MED ORDER — ENSURE COMPLETE PO LIQD
237.0000 mL | Freq: Three times a day (TID) | ORAL | Status: DC
  Administered 2013-06-27 – 2013-07-01 (×12): 237 mL via ORAL
  Filled 2013-06-27: qty 237

## 2013-06-27 MED ORDER — PIPERACILLIN-TAZOBACTAM 3.375 G IVPB 30 MIN: 3.3750 g | Freq: Four times a day (QID) | INTRAVENOUS | Status: DC

## 2013-06-27 MED ORDER — ENOXAPARIN SODIUM 40 MG/0.4ML ~~LOC~~ SOLN
40.0000 mg | SUBCUTANEOUS | Status: DC
  Administered 2013-06-27 – 2013-07-01 (×5): 40 mg via SUBCUTANEOUS
  Filled 2013-06-27 (×5): qty 0.4

## 2013-06-27 MED ORDER — ACETAMINOPHEN 650 MG RE SUPP: 650.0000 mg | Freq: Four times a day (QID) | RECTAL | Status: DC | PRN

## 2013-06-27 MED ORDER — ENSURE PLUS PO LIQD: 237.0000 mL | Freq: Three times a day (TID) | ORAL | Status: DC

## 2013-06-27 MED ORDER — ONDANSETRON HCL 4 MG PO TABS
4.0000 mg | ORAL_TABLET | Freq: Four times a day (QID) | ORAL | Status: DC | PRN
  Filled 2013-06-27: qty 1

## 2013-06-27 MED ORDER — SENNA 8.6 MG PO TABS
4.0000 | ORAL_TABLET | Freq: Two times a day (BID) | ORAL | Status: DC
  Administered 2013-06-27 – 2013-07-01 (×9): 34.4 mg via ORAL
  Filled 2013-06-27 (×12): qty 4

## 2013-06-27 MED ORDER — PANTOPRAZOLE SODIUM 40 MG PO TBEC
40.0000 mg | DELAYED_RELEASE_TABLET | Freq: Every day | ORAL | Status: DC
  Administered 2013-06-27 – 2013-07-01 (×4): 40 mg via ORAL
  Filled 2013-06-27 (×3): qty 1

## 2013-06-27 MED ORDER — ONDANSETRON HCL 4 MG/2ML IJ SOLN: 4.0000 mg | Freq: Four times a day (QID) | INTRAMUSCULAR | Status: DC | PRN

## 2013-06-27 MED ORDER — FLUTICASONE PROPIONATE 50 MCG/ACT NA SUSP
1.0000 | Freq: Every day | NASAL | Status: DC
  Administered 2013-06-27 – 2013-07-01 (×5): 1 via NASAL
  Filled 2013-06-27: qty 16

## 2013-06-27 MED ORDER — ALUM & MAG HYDROXIDE-SIMETH 200-200-20 MG/5ML PO SUSP: 30.0000 mL | Freq: Four times a day (QID) | ORAL | Status: DC | PRN

## 2013-06-27 MED ORDER — MORPHINE SULFATE 4 MG/ML IJ SOLN
4.0000 mg | Freq: Once | INTRAMUSCULAR | Status: AC
  Administered 2013-06-27: 4 mg via INTRAVENOUS
  Filled 2013-06-27: qty 1

## 2013-06-27 MED ORDER — OXYCODONE HCL 5 MG PO TABS
5.0000 mg | ORAL_TABLET | ORAL | Status: DC | PRN
  Administered 2013-06-29: 5 mg via ORAL
  Filled 2013-06-27: qty 1

## 2013-06-27 NOTE — ED Notes (Signed)
Pt.'s caregiver reported that pt. has boils at left groin and right buttocks onset this week , pt. lives at a group home , history of mental retardation / cerebral palsy.

## 2013-06-27 NOTE — H&P (Signed)
[facility] History and Physical  Connor RatelGilbert E Pertuit JYN:[id number] DOB: 01/24/1946 DOA: 06/27/2013  Referring physician:  PCP: No PCP Per Patient   Chief Complaint: Right buttock cellulitis  HPI: Connor Todd is a 68 y.o. male with a past medical history of mental retardation and cerebral palsy, wheelchair-bound at baseline, who was brought to the emergency room by caregiver with complaints of right buttock pain and swelling. History was obtained from caregiver present at bedside, as patient is unable to provide history or participate in his own plan of care. Caregiver reporting that she noticed a small pustule in his right buttock last Monday. This continued to progress over the week, with erythema and induration becoming worse. Over the week he has been increasingly agitated and on several occasions has attempted to bite his caregivers. He has not been noted to spike a temperature, neither has he had nausea, vomiting, chills, rigors, seizure-like activity. He has not been on antimicrobial therapy prior to this presentation. In the emergency room he was administered IV clindamycin.                                                                                                                                                                                                                                                                                                                Review of Systems:  A reliable review of systems cannot be obtained given MR   Past Medical History  Diagnosis Date  . Reflux   . Thyroid disease   . Development delay   . GERD (gastroesophageal reflux disease)   . Seasonal allergies   . Hypercholesteremia   . Constipation   . Cerebral palsy   . Mental retardation    Past Surgical History  Procedure Laterality Date  . Cystoscopy  11/2009  . Circumcision N/A 11/13/2012    Procedure: CIRCUMCISION ADULT;  Surgeon: Ky BarbanMohammad I Javaid, MD;  Location: [facility];   Service: Urology;  Laterality: N/A;   Social History:  reports that he has never smoked.  He does not have any smokeless tobacco history on file. He reports that he does not drink alcohol or use illicit drugs.  Allergies  Allergen Reactions  . Collard Greens Imagene Riches Virosa] Anaphylaxis  . Tomato Anaphylaxis  . Caffeine   . Chocolate   . Milk-Related Compounds     Iced Milk ONLY: Can have milk at room temperature only  . Phenothiazines     No family history on file.   Prior to Admission medications   Medication Sig Start Date End Date Taking? Authorizing Provider  capsaicin (ZOSTRIX) 0.025 % cream Apply topically 2 (two) times daily.   Yes Historical Provider, MD  ENSURE PLUS (ENSURE PLUS) LIQD Take 237 mLs by mouth 3 (three) times daily between meals.   Yes Historical Provider, MD  fluticasone (FLONASE) 50 MCG/ACT nasal spray Place 1 spray into both nostrils daily.   Yes Historical Provider, MD  levothyroxine (SYNTHROID, LEVOTHROID) 50 MCG tablet Take 50 mcg by mouth daily.   Yes Historical Provider, MD  loratadine (CLARITIN) 10 MG tablet Take 10 mg by mouth daily.   Yes Historical Provider, MD  LORazepam (ATIVAN) 1 MG tablet Take 0.5-1 mg by mouth 2 (two) times daily as needed for anxiety.    Yes Historical Provider, MD  Multiple Vitamin (MULTIVITAMIN WITH MINERALS) TABS Take 1 tablet by mouth daily.   Yes Historical Provider, MD  omeprazole (PRILOSEC) 20 MG capsule Take 20 mg by mouth 2 (two) times daily.    Yes Historical Provider, MD  senna (SENOKOT) 8.6 MG TABS Take 4 tablets by mouth 2 (two) times daily.    Yes Historical Provider, MD  simvastatin (ZOCOR) 20 MG tablet Take 20 mg by mouth every evening.   Yes Historical Provider, MD  sucralfate (CARAFATE) 1 G tablet Take 1 g by mouth daily as needed.    Yes Historical Provider, MD   Physical Exam: Filed Vitals:   06/27/13 0601  BP: 100/52  Pulse: 94  Temp:   Resp:     BP 100/52  Pulse 94  Temp(Src) 99.5 F (37.5 C)  (Oral)  Resp 18  SpO2 95%  General:  Patient appeared to be agitated during my encounter, cannot follow commands for me or answer questions. Eyes: PERRL, normal lids, irises & conjunctiva ENT: grossly normal hearing, lips & tongue Neck: no LAD, masses or thyromegaly Cardiovascular:  Tachycardic RRR, no m/r/g. No LE edema. Telemetry: SR, tachycardic   Respiratory: CTA bilaterally, no w/r/r. Normal respiratory effort. Abdomen: soft, ntnd Skin: There is erythema and induration involving the right buttock region, with a 2 cm region of ulceration noted in middle. Purulence can not be expressed. The area is tender to palpation.  Musculoskeletal: grossly normal tone BUE/BLE Psychiatric: grossly normal mood and affect, speech fluent and appropriate Neurologic: grossly non-focal.          Labs on Admission:  Basic Metabolic Panel:  Recent Labs Lab 06/27/13 0358  NA 144  K 4.3  CL 104  CO2 27  GLUCOSE 130*  BUN 22  CREATININE 0.75  CALCIUM 9.7   Liver Function Tests: No results found for this basename: AST, ALT, ALKPHOS, BILITOT, PROT, ALBUMIN,  in the last 168 hours No results found for this basename: LIPASE, AMYLASE,  in the last 168 hours No results found for this basename: AMMONIA,  in the last 168 hours CBC:  Recent Labs Lab 06/27/13 0358  WBC 10.9*  NEUTROABS 8.8*  HGB 13.0  HCT 38.8*  MCV 93.9  PLT 256  Cardiac Enzymes: No results found for this basename: CKTOTAL, CKMB, CKMBINDEX, TROPONINI,  in the last 168 hours  BNP (last 3 results) No results found for this basename: PROBNP,  in the last 8760 hours CBG: No results found for this basename: GLUCAP,  in the last 168 hours  Radiological Exams on Admission: [place] Extrem Low Right Ltd  06/27/2013   CLINICAL DATA:  Pain and swelling right buttock region. Rule out abscess  EXAM: ULTRASOUND right LOWER EXTREMITY LIMITED  TECHNIQUE: Ultrasound examination of the lower extremity soft tissues was performed in the area  of clinical concern.  COMPARISON:  None  FINDINGS: Scanning was limited to the right buttock region. Negative for abscess. No mass or fluid collection is identified.  IMPRESSION: Negative for abscess in the right buttock.   Electronically Signed   By: Marlan Palau M.D.   On: 06/27/2013 06:52    EKG: Independently reviewed.   Assessment/Plan Principal Problem:   Cellulitis and abscess of buttock Active Problems:   Mental retardation   Hypothyroidism   GERD (gastroesophageal reflux disease)   1. Right Buttock Cellulitis. Patient presenting with pain, erythema, induration over right buttock. At baseline he is wheelchair-bound. Will initiate empiric IV antibiotic therapy with IV vancomycin and Zosyn, pharmacy consultation for dosing. Followup on blood cultures, provide supportive care. Will place a consult for wound care as well. I could not express purulence on my exam. Will monitor response to antibiotics. 2. Hypothyroidism. Will check a TSH meanwhile continue home dose of his thyroid replacement therapy  3.  Gastroesophageal reflux disease. Continue PPI therapy with Protonix. We'll also continue Carafate 3 times a day with meals 4. Mental retardation. Patient's increasingly agitated, likely precipitated by pain coming from right buttock cellulitis. Caregivers present at bedside. 5. Dyslipidemia. Continue Zocor 6. DVT prophylaxis. Lovenox    Code Status: Full Code Family Communication: I spoke with caregivers present at bedside Disposition Plan: Will admit to Med/Surg, anticipate will require where the 2 night hospitalization  Time spent: 65 minutes  Jeralyn Bennett [facility] Pager [phone]

## 2013-06-27 NOTE — Progress Notes (Signed)
ANTIBIOTIC CONSULT NOTE - INITIAL  Pharmacy Consult for Vancomycin Indication: Cellulitis of R gluteal region  Allergies  Allergen Reactions  . Collard Greens Imagene Riches[Lactuca Virosa] Anaphylaxis  . Tomato Anaphylaxis  . Caffeine   . Chocolate   . Milk-Related Compounds     Iced Milk ONLY: Can have milk at room temperature only  . Phenothiazines     Patient Measurements:   Adjusted Body Weight:    Vital Signs: Temp: 99.5 F (37.5 C) (03/14 0122) Temp src: Oral (03/14 0122) BP: 100/72 mmHg (03/14 0815) Pulse Rate: 86 (03/14 0815) Intake/Output from previous day:   Intake/Output from this shift:    Labs:  Recent Labs  06/27/13 0358  WBC 10.9*  HGB 13.0  PLT 256  CREATININE 0.75   The CrCl is unknown because both a height and weight (above a minimum accepted value) are required for this calculation. No results found for this basename: VANCOTROUGH, VANCOPEAK, VANCORANDOM, GENTTROUGH, GENTPEAK, GENTRANDOM, TOBRATROUGH, TOBRAPEAK, TOBRARND, AMIKACINPEAK, AMIKACINTROU, AMIKACIN,  in the last 72 hours   Microbiology: No results found for this or any previous visit (from the past 720 hour(s)).  Medical History: Past Medical History  Diagnosis Date  . Reflux   . Thyroid disease   . Development delay   . GERD (gastroesophageal reflux disease)   . Seasonal allergies   . Hypercholesteremia   . Constipation   . Cerebral palsy   . Mental retardation     Medications:  Prescriptions prior to admission  Medication Sig Dispense Refill  . capsaicin (ZOSTRIX) 0.025 % cream Apply topically 2 (two) times daily.      Marland Kitchen. ENSURE PLUS (ENSURE PLUS) LIQD Take 237 mLs by mouth 3 (three) times daily between meals.      . fluticasone (FLONASE) 50 MCG/ACT nasal spray Place 1 spray into both nostrils daily.      Marland Kitchen. levothyroxine (SYNTHROID, LEVOTHROID) 50 MCG tablet Take 50 mcg by mouth daily.      Marland Kitchen. loratadine (CLARITIN) 10 MG tablet Take 10 mg by mouth daily.      Marland Kitchen. LORazepam (ATIVAN) 1  MG tablet Take 0.5-1 mg by mouth 2 (two) times daily as needed for anxiety.       . Multiple Vitamin (MULTIVITAMIN WITH MINERALS) TABS Take 1 tablet by mouth daily.      Marland Kitchen. omeprazole (PRILOSEC) 20 MG capsule Take 20 mg by mouth 2 (two) times daily.       Marland Kitchen. senna (SENOKOT) 8.6 MG TABS Take 4 tablets by mouth 2 (two) times daily.       . simvastatin (ZOCOR) 20 MG tablet Take 20 mg by mouth every evening.      . sucralfate (CARAFATE) 1 G tablet Take 1 g by mouth daily as needed.        Assessment: 68 y/o M from a group home with h/o mental retardation / cerebral palsy presents with onset of boils at left groin and right buttocks  this week. No abscess on U/S. No purulence.  Labs: Scr 0.75. CrCl 77. WBC 10.9  Goal of Therapy:  Vancomycin trough level 10-15 mcg/ml  Plan:  Zosyn 3.375g IV q8hr Vancomycin 1500mg  IV q24h. Trough at steady state.  Manar Smalling S. Merilynn Finlandobertson, PharmD, BCPS Clinical Staff Pharmacist Pager [phone]  Misty Stanleyobertson, Vanice Rappa Stillinger 06/27/2013,10:37 AM

## 2013-06-27 NOTE — ED Provider Notes (Signed)
CSN: [id number]     Arrival date & time 06/27/13  0018 History   First MD Initiated Contact with Patient 06/27/13 0302     No chief complaint on file.    (Consider location/radiation/quality/duration/timing/severity/associated sxs/prior Treatment) HPI  This patient is a 68 yo man with cerebral palsy and MR. The patient is non-ambulatory and non-verbal. He has a 24 hr caregiver. She reports that the patient had a small pustule over his right buttock yesterday which, in the past 24 hrs, has progressed to involve the entire right buttock. Caregiver has not noticed a fever. The patient has seemed increasingly agitated from his baseline.    Past Medical History  Diagnosis Date  . Reflux   . Thyroid disease   . Development delay   . GERD (gastroesophageal reflux disease)   . Seasonal allergies   . Hypercholesteremia   . Constipation   . Cerebral palsy   . Mental retardation    Past Surgical History  Procedure Laterality Date  . Cystoscopy  11/2009  . Circumcision N/A 11/13/2012    Procedure: CIRCUMCISION ADULT;  Surgeon: Ky BarbanMohammad I Javaid, MD;  Location: [facility];  Service: Urology;  Laterality: N/A;   No family history on file. History  Substance Use Topics  . Smoking status: Never Smoker   . Smokeless tobacco: Not on file  . Alcohol Use: No    Review of Systems Unable to obtain from the patient who is non-verbal. Note, please, that level 5 caveat applies.     Allergies  Collard greens; Tomato; Caffeine; Chocolate; Milk-related compounds; and Phenothiazines  Home Medications   Current Outpatient Rx  Name  Route  Sig  Dispense  Refill  . capsaicin (ZOSTRIX) 0.025 % cream   Topical   Apply topically 2 (two) times daily.         Marland Kitchen. ENSURE PLUS (ENSURE PLUS) LIQD   Oral   Take 237 mLs by mouth 3 (three) times daily between meals.         . fluticasone (FLONASE) 50 MCG/ACT nasal spray   Each Nare   Place 1 spray into both nostrils daily.         Marland Kitchen. levothyroxine  (SYNTHROID, LEVOTHROID) 50 MCG tablet   Oral   Take 50 mcg by mouth daily.         Marland Kitchen. loratadine (CLARITIN) 10 MG tablet   Oral   Take 10 mg by mouth daily.         Marland Kitchen. LORazepam (ATIVAN) 1 MG tablet   Oral   Take 0.5-1 mg by mouth 2 (two) times daily as needed for anxiety.          . Multiple Vitamin (MULTIVITAMIN WITH MINERALS) TABS   Oral   Take 1 tablet by mouth daily.         Marland Kitchen. omeprazole (PRILOSEC) 20 MG capsule   Oral   Take 20 mg by mouth 2 (two) times daily.          Marland Kitchen. senna (SENOKOT) 8.6 MG TABS   Oral   Take 4 tablets by mouth 2 (two) times daily.          . simvastatin (ZOCOR) 20 MG tablet   Oral   Take 20 mg by mouth every evening.         . sucralfate (CARAFATE) 1 G tablet   Oral   Take 1 g by mouth daily as needed.           BP 100/52  Pulse 94  Temp(Src) 99.5 F (37.5 C) (Oral)  Resp 18  SpO2 95% Physical Exam  Gen:  well nourished appearing Head: NCAT Eyes: PERL, EOMI Nose: no epistaixis or rhinorrhea Mouth/throat: mucosa is moist and pink Neck: supple, no stridor Lungs: CTA B, no wheezing, rhonchi or rales CV: RRR, no murmur, extremities appear well perfused.  Abd: soft, notender, nondistended GU: normal male genitalia, there is an approximately 1cm pustule over the left scrotum, no surrounding erythema or induration.  Back: no ttp, no cva ttp Skin: warm and dry Ext: the entire right buttock is erythematous and indurated and tender, no fluctuant masses palpable, contractures of arms and legs bilaterally Neuro: CN ii-xii grossly intact, no focal deficits Psyche; normal affect,  calm and cooperative.   ED Course  Procedures (including critical care time) Labs Review  Results for orders placed during the hospital encounter of 06/27/13 (from the past 24 hour(s))  LACTIC ACID, PLASMA     Status: None   Collection Time    06/27/13  3:42 AM      Result Value Ref Range   Lactic Acid, Venous 1.0  0.5 - 2.2 mmol/L  CBC WITH  DIFFERENTIAL     Status: Abnormal   Collection Time    06/27/13  3:58 AM      Result Value Ref Range   WBC 10.9 (*) 4.0 - 10.5 K/uL   RBC 4.13 (*) 4.22 - 5.81 MIL/uL   Hemoglobin 13.0  13.0 - 17.0 g/dL   HCT 27.2 (*) 53.6 - 64.4 %   MCV 93.9  78.0 - 100.0 fL   MCH 31.5  26.0 - 34.0 pg   MCHC 33.5  30.0 - 36.0 g/dL   RDW 03.4  74.2 - 59.5 %   Platelets 256  150 - 400 K/uL   Neutrophils Relative % 81 (*) 43 - 77 %   Neutro Abs 8.8 (*) 1.7 - 7.7 K/uL   Lymphocytes Relative 6 (*) 12 - 46 %   Lymphs Abs 0.6 (*) 0.7 - 4.0 K/uL   Monocytes Relative 13 (*) 3 - 12 %   Monocytes Absolute 1.5 (*) 0.1 - 1.0 K/uL   Eosinophils Relative 0  0 - 5 %   Eosinophils Absolute 0.0  0.0 - 0.7 K/uL   Basophils Relative 0  0 - 1 %   Basophils Absolute 0.0  0.0 - 0.1 K/uL  BASIC METABOLIC PANEL     Status: Abnormal   Collection Time    06/27/13  3:58 AM      Result Value Ref Range   Sodium 144  137 - 147 mEq/L   Potassium 4.3  3.7 - 5.3 mEq/L   Chloride 104  96 - 112 mEq/L   CO2 27  19 - 32 mEq/L   Glucose, Bld 130 (*) 70 - 99 mg/dL   BUN 22  6 - 23 mg/dL   Creatinine, Ser 6.38  0.50 - 1.35 mg/dL   Calcium 9.7  8.4 - 75.6 mg/dL   GFR calc non Af Amer >90  >90 mL/min   GFR calc Af Amer >90  >90 mL/min     Imaging Review [place] Extrem Low Right Ltd  06/27/2013   CLINICAL DATA:  Pain and swelling right buttock region. Rule out abscess  EXAM: ULTRASOUND right LOWER EXTREMITY LIMITED  TECHNIQUE: Ultrasound examination of the lower extremity soft tissues was performed in the area of clinical concern.  COMPARISON:  None  FINDINGS: Scanning was limited to the  right buttock region. Negative for abscess. No mass or fluid collection is identified.  IMPRESSION: Negative for abscess in the right buttock.   Electronically Signed   By: Marlan Palau M.D.   On: 06/27/2013 06:52      MDM   Patient with extensive cellulitis of the right gluteal region. No abscess on U/S. We are treating empirically with abx. The  patient needs to be admitted for observation to make sure he has an adequate response.    Brandt Loosen, MD 06/27/13 [phone]

## 2013-06-28 LAB — BASIC METABOLIC PANEL
BUN: 16 mg/dL (ref 6–23)
CO2: 25 meq/L (ref 19–32)
Calcium: 9.1 mg/dL (ref 8.4–10.5)
Chloride: 106 mEq/L (ref 96–112)
Creatinine, Ser: 0.7 mg/dL (ref 0.50–1.35)
GFR calc Af Amer: >90 mL/min (ref >90)
GFR calc non Af Amer: >90 mL/min (ref >90)
Glucose, Bld: 119 mg/dL — ABNORMAL HIGH (ref 70–99)
POTASSIUM: 3.6 meq/L — AB (ref 3.7–5.3)
Sodium: 144 mEq/L (ref 137–147)

## 2013-06-28 LAB — CBC
HEMATOCRIT: 33.8 % — AB (ref 39.0–52.0)
HEMOGLOBIN: 11.4 g/dL — AB (ref 13.0–17.0)
MCH: 32 pg (ref 26.0–34.0)
MCHC: 33.7 g/dL (ref 30.0–36.0)
MCV: 94.9 fL (ref 78.0–100.0)
Platelets: 215 10*3/uL (ref 150–400)
RBC: 3.56 MIL/uL — ABNORMAL LOW (ref 4.22–5.81)
RDW: 13.9 % (ref 11.5–15.5)
WBC: 8.5 10*3/uL (ref 4.0–10.5)

## 2013-06-28 MED ORDER — POTASSIUM CHLORIDE CRYS ER 20 MEQ PO TBCR
40.0000 meq | EXTENDED_RELEASE_TABLET | Freq: Once | ORAL | Status: DC
  Filled 2013-06-28: qty 2

## 2013-06-28 MED ORDER — POTASSIUM CHLORIDE 10 MEQ/100ML IV SOLN
10.0000 meq | INTRAVENOUS | Status: AC
  Administered 2013-06-28 (×2): 10 meq via INTRAVENOUS
  Filled 2013-06-28 (×2): qty 100

## 2013-06-28 MED ORDER — COLLAGENASE 250 UNIT/GM EX OINT
TOPICAL_OINTMENT | Freq: Every day | CUTANEOUS | Status: DC
  Administered 2013-06-28: 1 via TOPICAL
  Administered 2013-06-29: 11:00:00 via TOPICAL
  Administered 2013-06-30 – 2013-07-01 (×2): 1 via TOPICAL
  Filled 2013-06-28: qty 30

## 2013-06-28 NOTE — Consult Note (Signed)
WOC wound consult note Reason for Consult: Cellulitis to pressure ulcer right buttocks. Present on admission.  Wound type: Pressure ulcer with cellulitis Pressure Ulcer POA: Yes Measurement: 3 cm x 2.2 cm unable to visualize wound bed due to slough.  Wound bed: 75% yellow slough.  25% pale pink Drainage (amount, consistency, odor) Minimal, serosanguinous no odor.  Periwound: Intact Dressing procedure/placement/frequency: Cleanse right buttock ulcer with NS and pat gently dry.  Apply Santyl ointment to wound bed, 1/8 inch thickness (opaque).  Top with Ns moist gauze and secure with 4x4 and tape.  Change daily.  Will not follow at this time.  Please re-consult if needed.  Maple HudsonKaren Danaye Sobh RN BSN [facility]ON Pager [phone]

## 2013-06-28 NOTE — Progress Notes (Signed)
UR completed 

## 2013-06-28 NOTE — Progress Notes (Signed)
PROGRESS NOTE  FLEET HIGHAM WRU:[id number] DOB: 1945/09/08 DOA: 06/27/2013 PCP: No PCP Per Patient  Assessment/Plan: Right Buttock Cellulitis.  IV vancomycin and Zosyn, pharmacy consultation for dosing.   blood cultures consult for wound care as well.  monitor response to antibiotics.   Hypothyroidism. Will check a TSH meanwhile continue home dose of his thyroid replacement therapy   Gastroesophageal reflux disease. Continue PPI therapy with Protonix. We'll also continue Carafate 3 times a day with meals   Mental retardation. Patient's increasingly agitated, likely precipitated by pain coming from right buttock cellulitis.   Dyslipidemia. Continue Zocor  Hypokalemia- replete  Code Status: full Family Communication: patient, no caregivers at bedside Disposition Plan: IV abx   Consultants:  Wound care  Procedures:  none    HPI/Subjective: No overnight events Verbal but difficult to understand  Objective: Filed Vitals:   06/28/13 0512  BP: 119/68  Pulse: 91  Temp: 98.9 F (37.2 C)  Resp: 18    Intake/Output Summary (Last 24 hours) at 06/28/13 0840 Last data filed at 06/28/13 0515  Gross per 24 hour  Intake   1450 ml  Output    651 ml  Net    799 ml   There were no vitals filed for this visit.  Exam:   General:  A+Ox3, NAD  Cardiovascular: rrr  Respiratory: clear  Abdomen: +BS, soft  Musculoskeletal: spastic movements  Skin: There is erythema and induration involving the right buttock region, with a 2 cm region of ulceration noted in middle. Purulence can not be expressed. The area is tender to palpation.    Data Reviewed: Basic Metabolic Panel:  Recent Labs Lab 06/27/13 0358 06/27/13 1343 06/28/13 0550  NA 144  --  144  K 4.3  --  3.6*  CL 104  --  106  CO2 27  --  25  GLUCOSE 130*  --  119*  BUN 22  --  16  CREATININE 0.75 0.65 0.70  CALCIUM 9.7  --  9.1   Liver Function Tests: No results found for this basename: AST, ALT,  ALKPHOS, BILITOT, PROT, ALBUMIN,  in the last 168 hours No results found for this basename: LIPASE, AMYLASE,  in the last 168 hours No results found for this basename: AMMONIA,  in the last 168 hours CBC:  Recent Labs Lab 06/27/13 0358 06/27/13 1343 06/28/13 0550  WBC 10.9* 8.9 8.5  NEUTROABS 8.8*  --   --   HGB 13.0 12.3* 11.4*  HCT 38.8* 36.3* 33.8*  MCV 93.9 93.8 94.9  PLT 256 237 215   Cardiac Enzymes: No results found for this basename: CKTOTAL, CKMB, CKMBINDEX, TROPONINI,  in the last 168 hours BNP (last 3 results) No results found for this basename: PROBNP,  in the last 8760 hours CBG: No results found for this basename: GLUCAP,  in the last 168 hours  Recent Results (from the past 240 hour(s))  MRSA PCR SCREENING     Status: Abnormal   Collection Time    06/27/13  1:13 PM      Result Value Ref Range Status   MRSA by PCR POSITIVE (*) NEGATIVE Final   Comment:            The GeneXpert MRSA Assay (FDA     approved for NASAL specimens     only), is one component of a     comprehensive MRSA colonization     surveillance program. It is not     intended to diagnose MRSA  infection nor to guide or     monitor treatment for     MRSA infections.     Studies: [place] Extrem Low Right Ltd  06/27/2013   CLINICAL DATA:  Pain and swelling right buttock region. Rule out abscess  EXAM: ULTRASOUND right LOWER EXTREMITY LIMITED  TECHNIQUE: Ultrasound examination of the lower extremity soft tissues was performed in the area of clinical concern.  COMPARISON:  None  FINDINGS: Scanning was limited to the right buttock region. Negative for abscess. No mass or fluid collection is identified.  IMPRESSION: Negative for abscess in the right buttock.   Electronically Signed   By: Marlan Palauharles  Clark M.D.   On: 06/27/2013 06:52    Scheduled Meds: . enoxaparin (LOVENOX) injection  40 mg Subcutaneous Q24H  . feeding supplement (ENSURE COMPLETE)  237 mL Oral TID BM  . fluticasone  1 spray Each Nare  Daily  . levothyroxine  50 mcg Oral QAC breakfast  . pantoprazole  40 mg Oral Daily  . piperacillin-tazobactam (ZOSYN)  IV  3.375 g Intravenous Q8H  . potassium chloride  40 mEq Oral Once  . senna  4 tablet Oral BID  . simvastatin  20 mg Oral QPM  . sucralfate  1 g Oral TID WC & HS  . vancomycin  1,500 mg Intravenous Q24H   Continuous Infusions: . sodium chloride 75 mL/hr at 06/27/13 1254   Antibiotics Given (last 72 hours)   Date/Time Action Medication Dose Rate   06/27/13 1255 Given   piperacillin-tazobactam (ZOSYN) IVPB 3.375 g 3.375 g 12.5 mL/hr   06/27/13 1255 Given   vancomycin (VANCOCIN) 1,500 mg in sodium chloride 0.9 % 500 mL IVPB 1,500 mg 250 mL/hr   06/27/13 1904 Given   piperacillin-tazobactam (ZOSYN) IVPB 3.375 g 3.375 g 12.5 mL/hr   06/28/13 0409 Given   piperacillin-tazobactam (ZOSYN) IVPB 3.375 g 3.375 g 12.5 mL/hr      Principal Problem:   Cellulitis and abscess of buttock Active Problems:   Mental retardation   Hypothyroidism   GERD (gastroesophageal reflux disease)    Time spent: 35 min    Sostenes Kauffmann  [facility] Pager [phone]. If 7PM-7AM, please contact night-coverage at [email or web address], password [email or web address] 06/28/2013, 8:40 AM  LOS: 1 day

## 2013-06-28 NOTE — Progress Notes (Signed)
Pt. Refusing PO potassium.  MD paged.  Will continue to monitor. Vanice Sarahhompson, Aarin Bluett L

## 2013-06-29 MED ORDER — CLINDAMYCIN HCL 300 MG PO CAPS
300.0000 mg | ORAL_CAPSULE | Freq: Four times a day (QID) | ORAL | Status: DC
  Administered 2013-06-29 – 2013-07-01 (×8): 300 mg via ORAL
  Filled 2013-06-29 (×12): qty 1

## 2013-06-29 MED ORDER — PRO-STAT SUGAR FREE PO LIQD
30.0000 mL | ORAL | Status: DC
  Administered 2013-06-30 – 2013-07-01 (×2): 30 mL via ORAL
  Filled 2013-06-29 (×3): qty 30

## 2013-06-29 NOTE — Progress Notes (Signed)
PROGRESS NOTE  Connor Todd:[id number] DOB: [birth year] DOA: 06/27/2013 PCP: No PCP Per Patient  Assessment/Plan: Right Buttock Cellulitis.  IV vancomycin and Zosyn, pharmacy consultation for dosing: day #3.   blood cultures NGTD consult for wound care:  Cleanse right buttock ulcer with NS and pat gently dry. Apply Santyl ointment to wound bed, 1/8 inch thickness (opaque). Top with Ns moist gauze and secure with 4x4 and tape. Change daily.  monitor response to antibiotics.   Hypothyroidism.  -TSH ok - continue home dose of his thyroid replacement therapy   Gastroesophageal reflux disease. Continue PPI therapy with Protonix. We'll also continue Carafate 3 times a day with meals   Mental retardation. Patient's increasingly agitated, likely precipitated by pain coming from right buttock cellulitis.   Dyslipidemia. Continue Zocor  Hypokalemia- replete -recheck in AM  Code Status: full Family Communication: patient, no caregivers at bedside Disposition Plan: IV abx   Consultants:  Wound care  Procedures:  none    HPI/Subjective: Awake, eating breakfast No SOB, no CP  Objective: Filed Vitals:   06/29/13 0514  BP: 111/72  Pulse: 77  Temp: 97.9 F (36.6 C)  Resp: 20    Intake/Output Summary (Last 24 hours) at 06/29/13 0826 Last data filed at 06/29/13 0515  Gross per 24 hour  Intake 1777.5 ml  Output   1275 ml  Net  502.5 ml   There were no vitals filed for this visit.  Exam:   General:  A+Ox3, NAD  Cardiovascular: rrr  Respiratory: clear  Abdomen: +BS, soft  Musculoskeletal: spastic movements  Skin: There is erythema and induration involving the right buttock region, with a 2 cm region of ulceration noted in middle. Purulence can not be expressed. The area is tender to palpation.    Data Reviewed: Basic Metabolic Panel:  Recent Labs Lab 06/27/13 0358 06/27/13 1343 06/28/13 0550  NA 144  --  144  K 4.3  --  3.6*  CL 104  --  106    CO2 27  --  25  GLUCOSE 130*  --  119*  BUN 22  --  16  CREATININE 0.75 0.65 0.70  CALCIUM 9.7  --  9.1   Liver Function Tests: No results found for this basename: AST, ALT, ALKPHOS, BILITOT, PROT, ALBUMIN,  in the last 168 hours No results found for this basename: LIPASE, AMYLASE,  in the last 168 hours No results found for this basename: AMMONIA,  in the last 168 hours CBC:  Recent Labs Lab 06/27/13 0358 06/27/13 1343 06/28/13 0550  WBC 10.9* 8.9 8.5  NEUTROABS 8.8*  --   --   HGB 13.0 12.3* 11.4*  HCT 38.8* 36.3* 33.8*  MCV 93.9 93.8 94.9  PLT 256 237 215   Cardiac Enzymes: No results found for this basename: CKTOTAL, CKMB, CKMBINDEX, TROPONINI,  in the last 168 hours BNP (last 3 results) No results found for this basename: PROBNP,  in the last 8760 hours CBG: No results found for this basename: GLUCAP,  in the last 168 hours  Recent Results (from the past 240 hour(s))  MRSA PCR SCREENING     Status: Abnormal   Collection Time    06/27/13  1:13 PM      Result Value Ref Range Status   MRSA by PCR POSITIVE (*) NEGATIVE Final   Comment:            The GeneXpert MRSA Assay (FDA     approved for NASAL specimens  only), is one component of a     comprehensive MRSA colonization     surveillance program. It is not     intended to diagnose MRSA     infection nor to guide or     monitor treatment for     MRSA infections.  CULTURE, BLOOD (ROUTINE X 2)     Status: None   Collection Time    06/27/13  1:41 PM      Result Value Ref Range Status   Specimen Description BLOOD LEFT ARM   Final   Special Requests BOTTLES DRAWN AEROBIC ONLY 10CC   Final   Culture  Setup Time     Final   Value: 06/27/2013 18:43     [facility]   Culture     Final   Value:        BLOOD CULTURE RECEIVED NO GROWTH TO DATE CULTURE WILL BE HELD FOR 5 DAYS BEFORE ISSUING A FINAL NEGATIVE REPORT     Performed at [facility]   Report Status PENDING   Incomplete   CULTURE, BLOOD (ROUTINE X 2)     Status: None   Collection Time    06/27/13  1:42 PM      Result Value Ref Range Status   Specimen Description BLOOD LEFT HAND   Final   Special Requests BOTTLES DRAWN AEROBIC ONLY 10CC   Final   Culture  Setup Time     Final   Value: 06/27/2013 18:42     [facility]formed at [facility]   Culture     Final   Value:        BLOOD CULTURE RECEIVED NO GROWTH TO DATE CULTURE WILL BE HELD FOR 5 DAYS BEFORE ISSUING A FINAL NEGATIVE REPORT     Performed at [facility]   Report Status PENDING   Incomplete     Studies: No results found.  Scheduled Meds: . collagenase   Topical Daily  . enoxaparin (LOVENOX) injection  40 mg Subcutaneous Q24H  . feeding supplement (ENSURE COMPLETE)  237 mL Oral TID BM  . fluticasone  1 spray Each Nare Daily  . levothyroxine  50 mcg Oral QAC breakfast  . pantoprazole  40 mg Oral Daily  . piperacillin-tazobactam (ZOSYN)  IV  3.375 g Intravenous Q8H  . senna  4 tablet Oral BID  . simvastatin  20 mg Oral QPM  . sucralfate  1 g Oral TID WC & HS  . vancomycin  1,500 mg Intravenous Q24H   Continuous Infusions:   Antibiotics Given (last 72 hours)   Date/Time Action Medication Dose Rate   06/27/13 1255 Given   piperacillin-tazobactam (ZOSYN) IVPB 3.375 g 3.375 g 12.5 mL/hr   06/27/13 1255 Given   vancomycin (VANCOCIN) 1,500 mg in sodium chloride 0.9 % 500 mL IVPB 1,500 mg 250 mL/hr   06/27/13 1904 Given   piperacillin-tazobactam (ZOSYN) IVPB 3.375 g 3.375 g 12.5 mL/hr   06/28/13 0409 Given   piperacillin-tazobactam (ZOSYN) IVPB 3.375 g 3.375 g 12.5 mL/hr   06/28/13 1028 Given   vancomycin (VANCOCIN) 1,500 mg in sodium chloride 0.9 % 500 mL IVPB 1,500 mg 250 mL/hr   06/28/13 1125 Given   piperacillin-tazobactam (ZOSYN) IVPB 3.375 g 3.375 g 12.5 mL/hr   06/28/13 1857 Given   piperacillin-tazobactam (ZOSYN) IVPB 3.375 g 3.375 g 12.5 mL/hr   06/29/13 0400 Given   piperacillin-tazobactam (ZOSYN) IVPB 3.375 g 3.375  g 12.5 mL/hr  Principal Problem:   Cellulitis and abscess of buttock Active Problems:   Mental retardation   Hypothyroidism   GERD (gastroesophageal reflux disease)    Time spent: 25 min    Kendra Woolford  [facility] Pager [phone]. If 7PM-7AM, please contact night-coverage at [email or web address], password [facility] 06/29/2013, 8:26 AM  LOS: 2 days

## 2013-06-29 NOTE — Clinical Social Work Psychosocial (Signed)
Clinical Social Work Department BRIEF PSYCHOSOCIAL ASSESSMENT 06/29/2013  Patient:  Connor Todd,Connor Todd     Account Number:  [id number]     Admit date:  06/27/2013  Clinical Social Worker:  Sherre LainSUMMERVILLE,EMILY, LCSWA  Date/Time:  06/29/2013 10:31 AM  Referred by:  Physician  Date Referred:  06/29/2013 Referred for  Other - See comment   Other Referral:   Pt from group home, [facility].   Interview type:  Other - See comment Other interview type:   CSW spoke to Connor Todd [phone]), admissions liaison with [facility] group home.    PSYCHOSOCIAL DATA Living Status:  FACILITY Admitted from facility:   Level of care:  Group Home Primary support name:  Connor Todd Primary support relationship to patient:  NONE Degree of support available:   Strong support system. Almira CoasterGina works [facility].    CURRENT CONCERNS Current Concerns  Post-Acute Placement   Other Concerns:   none.    SOCIAL WORK ASSESSMENT / PLAN CSW received consult during morning progression that pt is from group home ([facility]) and would be returning to the group home at time of discharge. RN provided CSW with contact information for pt's CSW and admissions liaison for group home Almira Coaster(Gina Staplesanner, [phone]). CSW spoke with Almira CoasterGina regarding pt's return to their group home once medically stable for discharge. Almira CoasterGina stated that pt would be able to return to group home at time of discharge. CSW informed MD of information above.   Assessment/plan status:  Psychosocial Support/Ongoing Assessment of Needs Other assessment/ plan:   none.   Information/referral to community resources:   Pt to return to group home at time of discharge.    PATIENTS/FAMILYS RESPONSE TO PLAN OF CARE: Medical team and pt's CSW are agreeable and understanding of CSW plan of care.     Darlyn ChamberEmily [place], LCSWA Clinical Social Worker [phone]

## 2013-06-29 NOTE — Clinical Social Work Note (Signed)
CSW has faxed clinical information to pt's CSW/admissions liaison for [facility] group home.  Contact information: Carlyon ShadowGina Tanner (phone: [phone], fax: [phone])  Darlyn ChamberEmily [place], [facility]SWA Clinical Social Worker [phone]

## 2013-06-29 NOTE — Progress Notes (Signed)
INITIAL NUTRITION ASSESSMENT  DOCUMENTATION CODES Per approved criteria  -Not Applicable   INTERVENTION: Continue Ensure Complete TID Provide Pro-stat once daily Recommend daily weights RD to monitor PO intake for adequacy and provide further intervention as needed  NUTRITION DIAGNOSIS: Increased nutrient needs related to cellulitis and cerebral palsy as evidenced by estimated energy/protein needs.   Goal: Pt to meet >/= 90% of their estimated nutrition needs   Monitor:  PO intake, weight trend, labs  Reason for Assessment: Malnutrition Screening Tool, score of   68 y.o. male  Admitting Dx: Cellulitis and abscess of buttock  ASSESSMENT: 68 y.o. male with a past medical history of mental retardation and cerebral palsy, wheelchair-bound at baseline, who was brought to the emergency room by caregiver with complaints of right buttock pain and swelling. patient is unable to provide history or participate in his own plan of care.  Using bed scale at time of visit, pt's weight was 112 lbs. Not quite sure of accuracy of weight but, if accurate pt has lost 13 lbs in the past 8 months.  Ensure Plus TID listed on PTA medication list and Ensure Complete has been ordered TID for this admission. RN reports pt ate well at breakfast and drank half an Ensure bottle. Lunch tray ate bedside untouched at 1430 hr. Per RN pt needs to be fed and will be given lunch shortly. Per nursing notes pt ate 40% of one meal on 3/14 and 100% of breakfast this morning.  Pt has severe muscle wasting of calves and patellar areas. Wasting difficult to assess given condition of cerebral palsy.   Height: Ht Readings from Last 1 Encounters:  11/12/12 5\' 4"  (1.626 m)    Weight: Wt Readings from Last 1 Encounters:  11/12/12 125 lb (56.7 kg)  06/29/13 112 lb (using bed scale, weight may not be accurate)  Ideal Body Weight: 120 lbs  % Ideal Body Weight: 93%  Wt Readings from Last 10 Encounters:  11/12/12 125 lb  (56.7 kg)    Usual Body Weight: unknown  % Usual Body Weight: NA  BMI:  Body Mass Index 19.22 kg/(m^2) (based on weight of 112 lbs)  Estimated Nutritional Needs: Kcal: 1400-1600 Protein: 70-80 grams Fluid: >/= 1.6 L/day  Skin: cellulitis of right buttocks  Diet Order: Dysphagia  EDUCATION NEEDS: -No education needs identified at this time   Intake/Output Summary (Last 24 hours) at 06/29/13 1423 Last data filed at 06/29/13 0900  Gross per 24 hour  Intake 2017.5 ml  Output   1275 ml  Net  742.5 ml    Last BM: 3/15   Labs:   Recent Labs Lab 06/27/13 0358 06/27/13 1343 06/28/13 0550  NA 144  --  144  K 4.3  --  3.6*  CL 104  --  106  CO2 27  --  25  BUN 22  --  16  CREATININE 0.75 0.65 0.70  CALCIUM 9.7  --  9.1  GLUCOSE 130*  --  119*    CBG (last 3)  No results found for this basename: GLUCAP,  in the last 72 hours  Scheduled Meds: . collagenase   Topical Daily  . enoxaparin (LOVENOX) injection  40 mg Subcutaneous Q24H  . feeding supplement (ENSURE COMPLETE)  237 mL Oral TID BM  . fluticasone  1 spray Each Nare Daily  . levothyroxine  50 mcg Oral QAC breakfast  . pantoprazole  40 mg Oral Daily  . piperacillin-tazobactam (ZOSYN)  IV  3.375 g Intravenous  Q8H  . senna  4 tablet Oral BID  . simvastatin  20 mg Oral QPM  . sucralfate  1 g Oral TID WC & HS  . vancomycin  1,500 mg Intravenous Q24H    Continuous Infusions:   Past Medical History  Diagnosis Date  . Reflux   . Thyroid disease   . Development delay   . GERD (gastroesophageal reflux disease)   . Seasonal allergies   . Hypercholesteremia   . Constipation   . Cerebral palsy   . Mental retardation     Past Surgical History  Procedure Laterality Date  . Cystoscopy  11/2009  . Circumcision N/A 11/13/2012    Procedure: CIRCUMCISION ADULT;  Surgeon: Ky BarbanMohammad I Javaid, MD;  Location: [facility];  Service: Urology;  Laterality: N/A;    Ian Malkineanne Barnett RD, LDN Inpatient Clinical  Dietitian Pager: [phone] After Hours Pager: [phone]

## 2013-06-30 LAB — BASIC METABOLIC PANEL
BUN: 10 mg/dL (ref 6–23)
CALCIUM: 9.4 mg/dL (ref 8.4–10.5)
CO2: 24 meq/L (ref 19–32)
CREATININE: 0.63 mg/dL (ref 0.50–1.35)
Chloride: 104 mEq/L (ref 96–112)
GFR calc Af Amer: >90 mL/min (ref >90)
Glucose, Bld: 106 mg/dL — ABNORMAL HIGH (ref 70–99)
Potassium: 4 mEq/L (ref 3.7–5.3)
Sodium: 142 mEq/L (ref 137–147)

## 2013-06-30 LAB — CBC
HCT: 36.5 % — ABNORMAL LOW (ref 39.0–52.0)
Hemoglobin: 12.2 g/dL — ABNORMAL LOW (ref 13.0–17.0)
MCH: 31.4 pg (ref 26.0–34.0)
MCHC: 33.4 g/dL (ref 30.0–36.0)
MCV: 93.8 fL (ref 78.0–100.0)
PLATELETS: 255 10*3/uL (ref 150–400)
RBC: 3.89 MIL/uL — ABNORMAL LOW (ref 4.22–5.81)
RDW: 13.7 % (ref 11.5–15.5)
WBC: 6.7 10*3/uL (ref 4.0–10.5)

## 2013-06-30 NOTE — Clinical Social Work Note (Addendum)
CSW spoke with Crist FatGayle Tanner of [facility] [phone]) regarding pt's discharge disposition. Per Inocencio HomesGayle, pt is able to return to their group home once medically stable for discharge. Inocencio HomesGayle informed CSW that group home would also be able to care for pt's wound. RN and Inocencio HomesGayle spoke regarding pt's medical supply needs for pt's wound care. CSW to continue to follow and assist with discharge once pt is medically stable.  UPDATE: Pt to be discharged back to pt's group home ([facility]) on 07/01/2013. [facility] has arranged for their facility to [facility]. Pt will be transported by Gerald Dexterracy Coker at 3:30pm. RN notified. Discharge packet complete and placed on pt's shadow chart. [facility] discharge summary [facility].  Darlyn ChamberEmily [place], LCSWA Clinical Social Worker [phone]

## 2013-06-30 NOTE — Progress Notes (Signed)
PROGRESS NOTE  Connor Todd ZOX:[id number] DOB: 1945-07-11 DOA: 06/27/2013 PCP: No PCP Per Patient  Assessment/Plan: Right Buttock Cellulitis.  Change to PO clindamycin as lost IV access.   blood cultures NGTD consult for wound care:  Cleanse right buttock ulcer with NS and pat gently dry. Apply Santyl ointment to wound bed, 1/8 inch thickness (opaque). Top with Ns moist gauze and secure with 4x4 and tape. Change daily.  monitor response to antibiotics.  - need to clarify with facility if they can manage dressing changes or if patient will need SNF  Hypothyroidism.  -TSH ok - continue home dose of his thyroid replacement therapy   Gastroesophageal reflux disease. Continue PPI therapy with Protonix. We'll also continue Carafate 3 times a day with meals   Mental retardation. Unsure of patient's baseline  Dyslipidemia. Continue Zocor  Hypokalemia- replete -recheck in AM  Code Status: full Family Communication: patient, no caregivers at bedside Disposition Plan:    Consultants:  Wound care  Procedures:  none    HPI/Subjective: Awake, eating breakfast Will smile No events overnight  Objective: Filed Vitals:   06/30/13 0548  BP: 137/75  Pulse: 86  Temp: 97.2 F (36.2 C)  Resp: 20    Intake/Output Summary (Last 24 hours) at 06/30/13 0926 Last data filed at 06/30/13 8119  Gross per 24 hour  Intake   1000 ml  Output   1626 ml  Net   -626 ml   There were no vitals filed for this visit.  Exam:   General:  A+Ox3, NAD  Cardiovascular: rrr  Respiratory: clear  Abdomen: +BS, soft  Musculoskeletal: spastic movements  Skin: There is erythema and induration involving the right buttock region, with a 2 cm region of ulceration noted in middle. Purulence can not be expressed. The area is tender to palpation.    Data Reviewed: Basic Metabolic Panel:  Recent Labs Lab 06/27/13 0358 06/27/13 1343 06/28/13 0550 06/30/13 0620  NA 144  --  144 142  K  4.3  --  3.6* 4.0  CL 104  --  106 104  CO2 27  --  25 24  GLUCOSE 130*  --  119* 106*  BUN 22  --  16 10  CREATININE 0.75 0.65 0.70 0.63  CALCIUM 9.7  --  9.1 9.4   Liver Function Tests: No results found for this basename: AST, ALT, ALKPHOS, BILITOT, PROT, ALBUMIN,  in the last 168 hours No results found for this basename: LIPASE, AMYLASE,  in the last 168 hours No results found for this basename: AMMONIA,  in the last 168 hours CBC:  Recent Labs Lab 06/27/13 0358 06/27/13 1343 06/28/13 0550 06/30/13 0620  WBC 10.9* 8.9 8.5 6.7  NEUTROABS 8.8*  --   --   --   HGB 13.0 12.3* 11.4* 12.2*  HCT 38.8* 36.3* 33.8* 36.5*  MCV 93.9 93.8 94.9 93.8  PLT 256 237 215 255   Cardiac Enzymes: No results found for this basename: CKTOTAL, CKMB, CKMBINDEX, TROPONINI,  in the last 168 hours BNP (last 3 results) No results found for this basename: PROBNP,  in the last 8760 hours CBG: No results found for this basename: GLUCAP,  in the last 168 hours  Recent Results (from the past 240 hour(s))  MRSA PCR SCREENING     Status: Abnormal   Collection Time    06/27/13  1:13 PM      Result Value Ref Range Status   MRSA by PCR POSITIVE (*) NEGATIVE Final  Comment:            The GeneXpert MRSA Assay (FDA     approved for NASAL specimens     only), is one component of a     comprehensive MRSA colonization     surveillance program. It is not     intended to diagnose MRSA     infection nor to guide or     monitor treatment for     MRSA infections.  CULTURE, BLOOD (ROUTINE X 2)     Status: None   Collection Time    06/27/13  1:41 PM      Result Value Ref Range Status   Specimen Description BLOOD LEFT ARM   Final   Special Requests BOTTLES DRAWN AEROBIC ONLY 10CC   Final   Culture  Setup Time     Final   Value: 06/27/2013 18:43     Performed at [facility]   Culture     Final   Value:        BLOOD CULTURE RECEIVED NO GROWTH TO DATE CULTURE WILL BE HELD FOR 5 DAYS BEFORE ISSUING  A FINAL NEGATIVE REPORT     Performed at [facility]   Report Status PENDING   Incomplete  CULTURE, BLOOD (ROUTINE X 2)     Status: None   Collection Time    06/27/13  1:42 PM      Result Value Ref Range Status   Specimen Description BLOOD LEFT HAND   Final   Special Requests BOTTLES DRAWN AEROBIC ONLY 10CC   Final   Culture  Setup Time     Final   Value: 06/27/2013 18:42     Performed at [facility]   Culture     Final   Value:        BLOOD CULTURE RECEIVED NO GROWTH TO DATE CULTURE WILL BE HELD FOR 5 DAYS BEFORE ISSUING A FINAL NEGATIVE REPORT     Performed at [facility]   Report Status PENDING   Incomplete     Studies: No results found.  Scheduled Meds: . clindamycin  300 mg Oral 4 times per day  . collagenase   Topical Daily  . enoxaparin (LOVENOX) injection  40 mg Subcutaneous Q24H  . feeding supplement (ENSURE COMPLETE)  237 mL Oral TID BM  . feeding supplement (PRO-STAT SUGAR FREE 64)  30 mL Oral Q24H  . fluticasone  1 spray Each Nare Daily  . levothyroxine  50 mcg Oral QAC breakfast  . pantoprazole  40 mg Oral Daily  . senna  4 tablet Oral BID  . simvastatin  20 mg Oral QPM  . sucralfate  1 g Oral TID WC & HS   Continuous Infusions:   Antibiotics Given (last 72 hours)   Date/Time Action Medication Dose Rate   06/27/13 1255 Given   piperacillin-tazobactam (ZOSYN) IVPB 3.375 g 3.375 g 12.5 mL/hr   06/27/13 1255 Given   vancomycin (VANCOCIN) 1,500 mg in sodium chloride 0.9 % 500 mL IVPB 1,500 mg 250 mL/hr   06/27/13 1904 Given   piperacillin-tazobactam (ZOSYN) IVPB 3.375 g 3.375 g 12.5 mL/hr   06/28/13 0409 Given   piperacillin-tazobactam (ZOSYN) IVPB 3.375 g 3.375 g 12.5 mL/hr   06/28/13 1028 Given   vancomycin (VANCOCIN) 1,500 mg in sodium chloride 0.9 % 500 mL IVPB 1,500 mg 250 mL/hr   06/28/13 1125 Given   piperacillin-tazobactam (ZOSYN) IVPB 3.375 g 3.375 g 12.5 mL/hr   06/28/13 1857  Given   piperacillin-tazobactam (ZOSYN) IVPB  3.375 g 3.375 g 12.5 mL/hr   06/29/13 0400 Given   piperacillin-tazobactam (ZOSYN) IVPB 3.375 g 3.375 g 12.5 mL/hr   06/29/13 1816 Given   clindamycin (CLEOCIN) capsule 300 mg 300 mg    06/30/13 0007 Given   clindamycin (CLEOCIN) capsule 300 mg 300 mg    06/30/13 0540 Given   clindamycin (CLEOCIN) capsule 300 mg 300 mg       Principal Problem:   Cellulitis and abscess of buttock Active Problems:   Mental retardation   Hypothyroidism   GERD (gastroesophageal reflux disease)    Time spent: 25 min    VANN, JESSICA  [facility] Pager [phone]. If 7PM-7AM, please contact night-coverage at [email or web address], password [facility] 06/30/2013, 9:26 AM  LOS: 3 days

## 2013-07-01 MED ORDER — PRO-STAT SUGAR FREE PO LIQD: 30.0000 mL | ORAL | Status: DC

## 2013-07-01 MED ORDER — COLLAGENASE 250 UNIT/GM EX OINT: TOPICAL_OINTMENT | Freq: Every day | CUTANEOUS | Status: DC

## 2013-07-01 MED ORDER — CLINDAMYCIN HCL 300 MG PO CAPS: 300.0000 mg | ORAL_CAPSULE | Freq: Four times a day (QID) | ORAL | Status: DC

## 2013-07-01 NOTE — Progress Notes (Signed)
Patient discharged, instructions given to rehab tech who transported him back  To group home via wheelchair.

## 2013-07-01 NOTE — Discharge Summary (Signed)
Physician Discharge Summary  Connor RatelGilbert E Gellatly ZOX:[id number] DOB: 02/21/1946 DOA: 06/27/2013  PCP: No PCP Per Patient  Admit date: 06/27/2013 Discharge date: 07/01/2013  Time spent: 35 minutes  Recommendations for Outpatient Follow-up:  Cleanse right buttock ulcer with NS and pat gently dry. Apply Santyl ointment to wound bed, 1/8 inch thickness (opaque). Top with Ns moist gauze and secure with 4x4 and tape. Change daily -follow up with PCP 1 week to ensure healing   Discharge Diagnoses:  Principal Problem:   Cellulitis and abscess of buttock Active Problems:   Mental retardation   Hypothyroidism   GERD (gastroesophageal reflux disease)   Discharge Condition: improved  Diet recommendation: DYS 1 thin  There were no vitals filed for this visit.  History of present illness:  Connor Todd is a 68 y.o. male with a past medical history of mental retardation and cerebral palsy, wheelchair-bound at baseline, who was brought to the emergency room by caregiver with complaints of right buttock pain and swelling. History was obtained from caregiver present at bedside, as patient is unable to provide history or participate in his own plan of care. Caregiver reporting that she noticed a small pustule in his right buttock last Monday. This continued to progress over the week, with erythema and induration becoming worse. Over the week he has been increasingly agitated and on several occasions has attempted to bite his caregivers. He has not been noted to spike a temperature, neither has he had nausea, vomiting, chills, rigors, seizure-like activity. He has not been on antimicrobial therapy prior to this presentation. In the emergency room he was administered IV clindamycin.    Hospital Course:  Right Buttock Cellulitis.  Change to PO clindamycin as lost IV access.  blood cultures NGTD  consult for wound care: Cleanse right buttock ulcer with NS and pat gently dry. Apply Santyl ointment to wound  bed, 1/8 inch thickness (opaque). Top with Ns moist gauze and secure with 4x4 and tape. Change daily.  -will need follow up to ensure healing  Hypothyroidism.  -TSH ok  - continue home dose of his thyroid replacement therapy   Gastroesophageal reflux disease. Continue PPI therapy with Protonix. We'll also continue Carafate 3 times a day with meals   Mental retardation. Unsure of patient's baseline   Dyslipidemia. Continue Zocor   Hypokalemia- repleted    Procedures:    Consultations:  Wound care  Discharge Exam: Filed Vitals:   07/01/13 0627  BP: 104/71  Pulse: 79  Temp: 98 F (36.7 C)  Resp: 21    General: eating breakfast- smiling Cardiovascular: rrr Respiratory: clear anterior  Discharge Instructions      Discharge Orders   Future Orders Complete By Expires   Diet - low sodium heart healthy  As directed    Discharge instructions  As directed    Comments:     Cleanse right buttock ulcer with NS and pat gently dry.  Apply Santyl ointment to wound bed, 1/8 inch thickness (opaque).  Top with Ns moist gauze and secure with 4x4 and tape.  Change daily -follow up with PCP to ensure healing - DYS 1 diet- thin   Increase activity slowly  As directed        Medication List    STOP taking these medications       capsaicin 0.025 % cream  Commonly known as:  ZOSTRIX      TAKE these medications       clindamycin 300 MG capsule  Commonly known  as:  CLEOCIN  Take 1 capsule (300 mg total) by mouth every 6 (six) hours.     collagenase ointment  Commonly known as:  SANTYL  Apply topically daily.     ENSURE PLUS Liqd  Take 237 mLs by mouth 3 (three) times daily between meals.     feeding supplement (PRO-STAT SUGAR FREE 64) Liqd  Take 30 mLs by mouth daily.     fluticasone 50 MCG/ACT nasal spray  Commonly known as:  FLONASE  Place 1 spray into both nostrils daily.     levothyroxine 50 MCG tablet  Commonly known as:  SYNTHROID, LEVOTHROID  Take 50 mcg  by mouth daily.     loratadine 10 MG tablet  Commonly known as:  CLARITIN  Take 10 mg by mouth daily.     LORazepam 1 MG tablet  Commonly known as:  ATIVAN  Take 0.5-1 mg by mouth 2 (two) times daily as needed for anxiety.     multivitamin with minerals Tabs tablet  Take 1 tablet by mouth daily.     omeprazole 20 MG capsule  Commonly known as:  PRILOSEC  Take 20 mg by mouth 2 (two) times daily.     senna 8.6 MG Tabs tablet  Commonly known as:  SENOKOT  Take 4 tablets by mouth 2 (two) times daily.     simvastatin 20 MG tablet  Commonly known as:  ZOCOR  Take 20 mg by mouth every evening.     sucralfate 1 G tablet  Commonly known as:  CARAFATE  Take 1 g by mouth daily as needed.       Allergies  Allergen Reactions  . Collard Greens Imagene Riches Virosa] Anaphylaxis  . Tomato Anaphylaxis  . Caffeine   . Chocolate   . Milk-Related Compounds     Iced Milk ONLY: Can have milk at room temperature only  . Phenothiazines       The results of significant diagnostics from this hospitalization (including imaging, microbiology, ancillary and laboratory) are listed below for reference.    Significant Diagnostic Studies: [place] Extrem Low Right Ltd  06/27/2013   CLINICAL DATA:  Pain and swelling right buttock region. Rule out abscess  EXAM: ULTRASOUND right LOWER EXTREMITY LIMITED  TECHNIQUE: Ultrasound examination of the lower extremity soft tissues was performed in the area of clinical concern.  COMPARISON:  None  FINDINGS: Scanning was limited to the right buttock region. Negative for abscess. No mass or fluid collection is identified.  IMPRESSION: Negative for abscess in the right buttock.   Electronically Signed   By: Marlan Palau M.D.   On: 06/27/2013 06:52    Microbiology: Recent Results (from the past 240 hour(s))  MRSA PCR SCREENING     Status: Abnormal   Collection Time    06/27/13  1:13 PM      Result Value Ref Range Status   MRSA by PCR POSITIVE (*) NEGATIVE Final    Comment:            The GeneXpert MRSA Assay (FDA     approved for NASAL specimens     only), is one component of a     comprehensive MRSA colonization     surveillance program. It is not     intended to diagnose MRSA     infection nor to guide or     monitor treatment for     MRSA infections.  CULTURE, BLOOD (ROUTINE X 2)     Status: None   Collection  Time    06/27/13  1:41 PM      Result Value Ref Range Status   Specimen Description BLOOD LEFT ARM   Final   Special Requests BOTTLES DRAWN AEROBIC ONLY 10CC   Final   Culture  Setup Time     Final   Value: 06/27/2013 18:43     Performed at [facility]   Culture     Final   Value:        BLOOD CULTURE RECEIVED NO GROWTH TO DATE CULTURE WILL BE HELD FOR 5 DAYS BEFORE ISSUING A FINAL NEGATIVE REPORT     Performed at [facility]   Report Status PENDING   Incomplete  CULTURE, BLOOD (ROUTINE X 2)     Status: None   Collection Time    06/27/13  1:42 PM      Result Value Ref Range Status   Specimen Description BLOOD LEFT HAND   Final   Special Requests BOTTLES DRAWN AEROBIC ONLY 10CC   Final   Culture  Setup Time     Final   Value: 06/27/2013 18:42     Performed at [facility]   Culture     Final   Value:        BLOOD CULTURE RECEIVED NO GROWTH TO DATE CULTURE WILL BE HELD FOR 5 DAYS BEFORE ISSUING A FINAL NEGATIVE REPORT     Performed at [facility]   Report Status PENDING   Incomplete     Labs: Basic Metabolic Panel:  Recent Labs Lab 06/27/13 0358 06/27/13 1343 06/28/13 0550 06/30/13 0620  NA 144  --  144 142  K 4.3  --  3.6* 4.0  CL 104  --  106 104  CO2 27  --  25 24  GLUCOSE 130*  --  119* 106*  BUN 22  --  16 10  CREATININE 0.75 0.65 0.70 0.63  CALCIUM 9.7  --  9.1 9.4   Liver Function Tests: No results found for this basename: AST, ALT, ALKPHOS, BILITOT, PROT, ALBUMIN,  in the last 168 hours No results found for this basename: LIPASE, AMYLASE,  in the last 168 hours No  results found for this basename: AMMONIA,  in the last 168 hours CBC:  Recent Labs Lab 06/27/13 0358 06/27/13 1343 06/28/13 0550 06/30/13 0620  WBC 10.9* 8.9 8.5 6.7  NEUTROABS 8.8*  --   --   --   HGB 13.0 12.3* 11.4* 12.2*  HCT 38.8* 36.3* 33.8* 36.5*  MCV 93.9 93.8 94.9 93.8  PLT 256 237 215 255   Cardiac Enzymes: No results found for this basename: CKTOTAL, CKMB, CKMBINDEX, TROPONINI,  in the last 168 hours BNP: BNP (last 3 results) No results found for this basename: PROBNP,  in the last 8760 hours CBG: No results found for this basename: GLUCAP,  in the last 168 hours     Signed:  Benjamine Mola, Othal Kubitz  [facility] 07/01/2013, 9:56 AM

## 2013-07-02 ENCOUNTER — Encounter: Payer: Self-pay | Admitting: Internal Medicine

## 2013-07-03 LAB — CULTURE, BLOOD (ROUTINE X 2)
CULTURE: NO GROWTH
Culture: NO GROWTH

## 2013-07-15 ENCOUNTER — Emergency Department (HOSPITAL_BASED_OUTPATIENT_CLINIC_OR_DEPARTMENT_OTHER)
Admission: EM | Admit: 2013-07-15 | Discharge: 2013-07-15 | Disposition: A | Discharge status: Home / Self Care | Payer: Medicare Other | Attending: Emergency Medicine | Admitting: Emergency Medicine

## 2013-07-15 ENCOUNTER — Encounter (HOSPITAL_BASED_OUTPATIENT_CLINIC_OR_DEPARTMENT_OTHER): Payer: Self-pay | Admitting: Emergency Medicine

## 2013-07-15 DIAGNOSIS — Z792 Long term (current) use of antibiotics: Secondary | ICD-10-CM | POA: Insufficient documentation

## 2013-07-15 DIAGNOSIS — E7800 Pure hypercholesterolemia, unspecified: Secondary | ICD-10-CM | POA: Insufficient documentation

## 2013-07-15 DIAGNOSIS — IMO0002 Reserved for concepts with insufficient information to code with codable children: Secondary | ICD-10-CM | POA: Insufficient documentation

## 2013-07-15 DIAGNOSIS — L0291 Cutaneous abscess, unspecified: Secondary | ICD-10-CM

## 2013-07-15 DIAGNOSIS — Z8669 Personal history of other diseases of the nervous system and sense organs: Secondary | ICD-10-CM | POA: Insufficient documentation

## 2013-07-15 DIAGNOSIS — Z8659 Personal history of other mental and behavioral disorders: Secondary | ICD-10-CM | POA: Insufficient documentation

## 2013-07-15 DIAGNOSIS — Z79899 Other long term (current) drug therapy: Secondary | ICD-10-CM | POA: Insufficient documentation

## 2013-07-15 DIAGNOSIS — K5900 Constipation, unspecified: Secondary | ICD-10-CM | POA: Insufficient documentation

## 2013-07-15 DIAGNOSIS — E079 Disorder of thyroid, unspecified: Secondary | ICD-10-CM | POA: Insufficient documentation

## 2013-07-15 DIAGNOSIS — K219 Gastro-esophageal reflux disease without esophagitis: Secondary | ICD-10-CM | POA: Insufficient documentation

## 2013-07-15 DIAGNOSIS — H44009 Unspecified purulent endophthalmitis, unspecified eye: Secondary | ICD-10-CM | POA: Insufficient documentation

## 2013-07-15 MED ORDER — LIDOCAINE-EPINEPHRINE-TETRACAINE (LET) SOLUTION
3.0000 mL | Freq: Once | NASAL | Status: AC
  Administered 2013-07-15: 3 mL via TOPICAL

## 2013-07-15 MED ORDER — LIDOCAINE-EPINEPHRINE-TETRACAINE (LET) SOLUTION
NASAL | Status: AC
  Administered 2013-07-15: 3 mL via TOPICAL
  Filled 2013-07-15: qty 3

## 2013-07-15 NOTE — ED Notes (Addendum)
Pt has boil over right eye since this weekend. Has been seen on Monday by PCP and prescribed doxycycline, this is day #4. Recently treated inpatient for an area on his bottom and dx with MRSA. Pt exhibiting signs of pain per staff.

## 2013-07-15 NOTE — ED Provider Notes (Signed)
CSN: [id number]     Arrival date & time 07/15/13  [id number] History   First MD Initiated Contact with Patient 07/15/13 [phone]     Chief Complaint  Patient presents with  . Eye Problem     (Consider location/radiation/quality/duration/timing/severity/associated sxs/prior Treatment) Patient is a 68 y.o. male presenting with eye problem.  Eye Problem  Level 5 caveat due to MR Pt with history of cerebral palsy and MR brought to the ED by caregiver for evaluation of abscess over the R eye. He was seen for same by PCP 2 days ago and started on Doxycycline. He had some spontaneous drainage yesterday but seemed to be in increased pain last night and this morning.   Past Medical History  Diagnosis Date  . Reflux   . Thyroid disease   . Development delay   . GERD (gastroesophageal reflux disease)   . Seasonal allergies   . Hypercholesteremia   . Constipation   . Cerebral palsy   . Mental retardation    Past Surgical History  Procedure Laterality Date  . Cystoscopy  11/2009  . Circumcision N/A 11/13/2012    Procedure: CIRCUMCISION ADULT;  Surgeon: Ky BarbanMohammad I Javaid, MD;  Location: [facility];  Service: Urology;  Laterality: N/A;   No family history on file. History  Substance Use Topics  . Smoking status: Never Smoker   . Smokeless tobacco: Not on file  . Alcohol Use: No    Review of Systems .Unable to assess due to mental status.      Allergies  Collard greens; Tomato; Caffeine; Chocolate; Milk-related compounds; and Phenothiazines  Home Medications   Current Outpatient Rx  Name  Route  Sig  Dispense  Refill  . Amino Acids-Protein Hydrolys (FEEDING SUPPLEMENT, PRO-STAT SUGAR FREE 64,) LIQD   Oral   Take 30 mLs by mouth daily.   900 mL   0   . clindamycin (CLEOCIN) 300 MG capsule   Oral   Take 1 capsule (300 mg total) by mouth every 6 (six) hours.   32 capsule   0   . collagenase (SANTYL) ointment   Topical   Apply topically daily.   15 g   0   . ENSURE PLUS (ENSURE  PLUS) LIQD   Oral   Take 237 mLs by mouth 3 (three) times daily between meals.         . fluticasone (FLONASE) 50 MCG/ACT nasal spray   Each Nare   Place 1 spray into both nostrils daily.         Marland Kitchen. levothyroxine (SYNTHROID, LEVOTHROID) 50 MCG tablet   Oral   Take 50 mcg by mouth daily.         Marland Kitchen. loratadine (CLARITIN) 10 MG tablet   Oral   Take 10 mg by mouth daily.         Marland Kitchen. LORazepam (ATIVAN) 1 MG tablet   Oral   Take 0.5-1 mg by mouth 2 (two) times daily as needed for anxiety.          . Multiple Vitamin (MULTIVITAMIN WITH MINERALS) TABS   Oral   Take 1 tablet by mouth daily.         Marland Kitchen. omeprazole (PRILOSEC) 20 MG capsule   Oral   Take 20 mg by mouth 2 (two) times daily.          Marland Kitchen. senna (SENOKOT) 8.6 MG TABS   Oral   Take 4 tablets by mouth 2 (two) times daily.          .Marland Kitchen  simvastatin (ZOCOR) 20 MG tablet   Oral   Take 20 mg by mouth every evening.         . sucralfate (CARAFATE) 1 G tablet   Oral   Take 1 g by mouth daily as needed.           BP 133/81  Pulse 108  Temp(Src) 97.6 F (36.4 C) (Oral)  Resp 18  Ht 5\' 5"  (1.651 m)  Wt 125 lb (56.7 kg)  BMI 20.80 kg/m2  SpO2 98% Physical Exam  Constitutional: He appears well-developed and well-nourished.  HENT:  Head: Normocephalic and atraumatic.  1cm fluctuant abscess over the R eyebrow, mild surrounding erythema  Neck: Neck supple.  Pulmonary/Chest: Effort normal.  Neurological: He is alert. No cranial nerve deficit.  Psychiatric: He has a normal mood and affect. His behavior is normal.    ED Course  Procedures (including critical care time)  INCISION AND DRAINAGE Performed by: Pollyann Savoy. Consent: Verbal consent obtained. Risks and benefits: risks, benefits and alternatives were discussed Time out performed prior to procedure Type: abscess Body area: R eyebrow Anesthesia: LET Incision was made with a scalpel. Complexity: simple Blunt dissection to break up  loculations Drainage: purulent Drainage amount: small Packing material: none Patient tolerance: Patient tolerated the procedure well with no immediate complications.     Labs Review Labs Reviewed - No data to display Imaging Review No results found.   EKG Interpretation None      MDM   Final diagnoses:  Abscess    Abscess/pustule drained, topical and oral Abx, local wound care.     Mariel Gaudin B. Bernette Mayers, MD 07/15/13 1139

## 2013-07-15 NOTE — Discharge Instructions (Signed)
Abscess An abscess is an infected area that contains a collection of pus and debris.It can occur in almost any part of the body. An abscess is also known as a furuncle or boil. CAUSES  An abscess occurs when tissue gets infected. This can occur from blockage of oil or sweat glands, infection of hair follicles, or a minor injury to the skin. As the body tries to fight the infection, pus collects in the area and creates pressure under the skin. This pressure causes pain. People with weakened immune systems have difficulty fighting infections and get certain abscesses more often.  SYMPTOMS Usually an abscess develops on the skin and becomes a painful mass that is red, warm, and tender. If the abscess forms under the skin, you may feel a moveable soft area under the skin. Some abscesses break open (rupture) on their own, but most will continue to get worse without care. The infection can spread deeper into the body and eventually into the bloodstream, causing you to feel ill.  DIAGNOSIS  Your caregiver will take your medical history and perform a physical exam. A sample of fluid may also be taken from the abscess to determine what is causing your infection. TREATMENT  Your caregiver may prescribe antibiotic medicines to fight the infection. However, taking antibiotics alone usually does not cure an abscess. Your caregiver may need to make a small cut (incision) in the abscess to drain the pus. In some cases, gauze is packed into the abscess to reduce pain and to continue draining the area. HOME CARE INSTRUCTIONS   Only take over-the-counter or prescription medicines for pain, discomfort, or fever as directed by your caregiver.  If you were prescribed antibiotics, take them as directed. Finish them even if you start to feel better.  If gauze is used, follow your caregiver's directions for changing the gauze.  To avoid spreading the infection:  Keep your draining abscess covered with a  bandage.  Wash your hands well.  Do not share personal care items, towels, or whirlpools with others.  Avoid skin contact with others.  Keep your skin and clothes clean around the abscess.  Keep all follow-up appointments as directed by your caregiver. SEEK MEDICAL CARE IF:   You have increased pain, swelling, redness, fluid drainage, or bleeding.  You have muscle aches, chills, or a general ill feeling.  You have a fever. MAKE SURE YOU:   Understand these instructions.  Will watch your condition.  Will get help right away if you are not doing well or get worse. Document Released: 01/10/2005 Document Revised: 10/02/2011 Document Reviewed: 06/15/2011 [employer] Patient Information 2014 [employer].  

## 2013-09-08 ENCOUNTER — Emergency Department (HOSPITAL_COMMUNITY)
Admission: EM | Admit: 2013-09-08 | Discharge: 2013-09-08 | Disposition: A | Discharge status: Home / Self Care | Payer: Medicare Other | Attending: Emergency Medicine | Admitting: Emergency Medicine

## 2013-09-08 ENCOUNTER — Encounter (HOSPITAL_COMMUNITY): Payer: Self-pay | Admitting: Emergency Medicine

## 2013-09-08 DIAGNOSIS — K5900 Constipation, unspecified: Secondary | ICD-10-CM | POA: Insufficient documentation

## 2013-09-08 DIAGNOSIS — L0291 Cutaneous abscess, unspecified: Secondary | ICD-10-CM

## 2013-09-08 DIAGNOSIS — E079 Disorder of thyroid, unspecified: Secondary | ICD-10-CM | POA: Insufficient documentation

## 2013-09-08 DIAGNOSIS — K219 Gastro-esophageal reflux disease without esophagitis: Secondary | ICD-10-CM | POA: Insufficient documentation

## 2013-09-08 DIAGNOSIS — L0390 Cellulitis, unspecified: Secondary | ICD-10-CM

## 2013-09-08 DIAGNOSIS — IMO0002 Reserved for concepts with insufficient information to code with codable children: Secondary | ICD-10-CM | POA: Insufficient documentation

## 2013-09-08 DIAGNOSIS — F79 Unspecified intellectual disabilities: Secondary | ICD-10-CM | POA: Insufficient documentation

## 2013-09-08 DIAGNOSIS — Z79899 Other long term (current) drug therapy: Secondary | ICD-10-CM | POA: Insufficient documentation

## 2013-09-08 DIAGNOSIS — R509 Fever, unspecified: Secondary | ICD-10-CM | POA: Insufficient documentation

## 2013-09-08 DIAGNOSIS — E7800 Pure hypercholesterolemia, unspecified: Secondary | ICD-10-CM | POA: Insufficient documentation

## 2013-09-08 LAB — CBC WITH DIFFERENTIAL/PLATELET
BASOS ABS: 0 10*3/uL (ref 0.0–0.1)
Basophils Relative: 0 % (ref 0–1)
EOS PCT: 1 % (ref 0–5)
Eosinophils Absolute: 0.1 10*3/uL (ref 0.0–0.7)
HCT: 34.1 % — ABNORMAL LOW (ref 39.0–52.0)
Hemoglobin: 11.2 g/dL — ABNORMAL LOW (ref 13.0–17.0)
LYMPHS PCT: 8 % — AB (ref 12–46)
Lymphs Abs: 0.7 10*3/uL (ref 0.7–4.0)
MCH: 30.1 pg (ref 26.0–34.0)
MCHC: 32.8 g/dL (ref 30.0–36.0)
MCV: 91.7 fL (ref 78.0–100.0)
Monocytes Absolute: 0.5 10*3/uL (ref 0.1–1.0)
Monocytes Relative: 6 % (ref 3–12)
NEUTROS ABS: 6.7 10*3/uL (ref 1.7–7.7)
Neutrophils Relative %: 85 % — ABNORMAL HIGH (ref 43–77)
PLATELETS: 269 10*3/uL (ref 150–400)
RBC: 3.72 MIL/uL — ABNORMAL LOW (ref 4.22–5.81)
RDW: 14.1 % (ref 11.5–15.5)
WBC: 8 10*3/uL (ref 4.0–10.5)

## 2013-09-08 LAB — URINALYSIS, ROUTINE W REFLEX MICROSCOPIC
Bilirubin Urine: NEGATIVE
Glucose, UA: NEGATIVE mg/dL
Hgb urine dipstick: NEGATIVE
KETONES UR: NEGATIVE mg/dL
LEUKOCYTES UA: NEGATIVE
NITRITE: NEGATIVE
Protein, ur: NEGATIVE mg/dL
Specific Gravity, Urine: 1.02 (ref 1.005–1.030)
UROBILINOGEN UA: 1 mg/dL (ref 0.0–1.0)
pH: 7 (ref 5.0–8.0)

## 2013-09-08 LAB — BASIC METABOLIC PANEL
BUN: 20 mg/dL (ref 6–23)
CALCIUM: 9.2 mg/dL (ref 8.4–10.5)
CHLORIDE: 96 meq/L (ref 96–112)
CO2: 22 mEq/L (ref 19–32)
Creatinine, Ser: 0.93 mg/dL (ref 0.50–1.35)
GFR calc Af Amer: >90 mL/min (ref >90)
GFR calc non Af Amer: 86 mL/min — ABNORMAL LOW (ref >90)
Glucose, Bld: 120 mg/dL — ABNORMAL HIGH (ref 70–99)
Potassium: 4.3 mEq/L (ref 3.7–5.3)
SODIUM: 136 meq/L — AB (ref 137–147)

## 2013-09-08 LAB — I-STAT CG4 LACTIC ACID, ED
LACTIC ACID, VENOUS: 2.79 mmol/L — AB (ref 0.5–2.2)
Lactic Acid, Venous: 1.44 mmol/L (ref 0.5–2.2)

## 2013-09-08 MED ORDER — LORAZEPAM 2 MG/ML IJ SOLN
2.0000 mg | Freq: Once | INTRAMUSCULAR | Status: AC
  Administered 2013-09-08: 2 mg via INTRAMUSCULAR
  Filled 2013-09-08: qty 1

## 2013-09-08 MED ORDER — SODIUM CHLORIDE 0.9 % IV BOLUS (SEPSIS)
1000.0000 mL | Freq: Once | INTRAVENOUS | Status: AC
  Administered 2013-09-08: 1000 mL via INTRAVENOUS

## 2013-09-08 MED ORDER — LIDOCAINE-EPINEPHRINE 2 %-1:100000 IJ SOLN
20.0000 mL | Freq: Once | INTRAMUSCULAR | Status: AC
  Administered 2013-09-08: 20 mL via INTRADERMAL
  Filled 2013-09-08: qty 1

## 2013-09-08 MED ORDER — KETOROLAC TROMETHAMINE 30 MG/ML IJ SOLN
30.0000 mg | Freq: Once | INTRAMUSCULAR | Status: AC
  Administered 2013-09-08: 30 mg via INTRAVENOUS
  Filled 2013-09-08: qty 1

## 2013-09-08 MED ORDER — VANCOMYCIN HCL IN DEXTROSE 1-5 GM/200ML-% IV SOLN
1000.0000 mg | Freq: Once | INTRAVENOUS | Status: AC
  Administered 2013-09-08: 1000 mg via INTRAVENOUS
  Filled 2013-09-08: qty 200

## 2013-09-08 MED ORDER — LIDOCAINE-EPINEPHRINE-TETRACAINE (LET) SOLUTION
3.0000 mL | Freq: Once | NASAL | Status: AC
  Administered 2013-09-08: 3 mL via TOPICAL
  Filled 2013-09-08: qty 3

## 2013-09-08 MED ORDER — ACETAMINOPHEN 500 MG PO TABS
1000.0000 mg | ORAL_TABLET | Freq: Once | ORAL | Status: AC
  Administered 2013-09-08: 1000 mg via ORAL
  Filled 2013-09-08: qty 2

## 2013-09-08 MED ORDER — IBUPROFEN 800 MG PO TABS
800.0000 mg | ORAL_TABLET | Freq: Once | ORAL | Status: AC
  Administered 2013-09-08: 800 mg via ORAL
  Filled 2013-09-08: qty 1

## 2013-09-08 MED ORDER — ACETAMINOPHEN 325 MG PO TABS: 650.0000 mg | ORAL_TABLET | Freq: Four times a day (QID) | ORAL | Status: AC | PRN

## 2013-09-08 MED ORDER — VANCOMYCIN HCL 10 G IV SOLR: 1500.0000 mg | INTRAVENOUS | Status: DC

## 2013-09-08 NOTE — ED Notes (Signed)
Writer notified ERP Ward of abnormal lactic lab results.

## 2013-09-08 NOTE — Discharge Instructions (Signed)
Please continue taking the Bactrim (sulfamethoxazole and trimethoprim) as prescribed until this antibiotic has been completed. You may clean this area with warm soap and water daily. You may change the dressing daily or anytime that you notice that the dressing is dirty or saturated. You may give the patient Tylenol 650 mg every 6 hours as needed for pain. If you notice the patient hasn't had fevers, vomiting, increased pain, increased redness or swelling, please return to the emergency department.   Abscess An abscess is an infected area that contains a collection of pus and debris.It can occur in almost any part of the body. An abscess is also known as a furuncle or boil. CAUSES  An abscess occurs when tissue gets infected. This can occur from blockage of oil or sweat glands, infection of hair follicles, or a minor injury to the skin. As the body tries to fight the infection, pus collects in the area and creates pressure under the skin. This pressure causes pain. People with weakened immune systems have difficulty fighting infections and get certain abscesses more often.  SYMPTOMS Usually an abscess develops on the skin and becomes a painful mass that is red, warm, and tender. If the abscess forms under the skin, you may feel a moveable soft area under the skin. Some abscesses break open (rupture) on their own, but most will continue to get worse without care. The infection can spread deeper into the body and eventually into the bloodstream, causing you to feel ill.  DIAGNOSIS  Your caregiver will take your medical history and perform a physical exam. A sample of fluid may also be taken from the abscess to determine what is causing your infection. TREATMENT  Your caregiver may prescribe antibiotic medicines to fight the infection. However, taking antibiotics alone usually does not cure an abscess. Your caregiver may need to make a small cut (incision) in the abscess to drain the pus. In some cases,  gauze is packed into the abscess to reduce pain and to continue draining the area. HOME CARE INSTRUCTIONS   Only take over-the-counter or prescription medicines for pain, discomfort, or fever as directed by your caregiver.  If you were prescribed antibiotics, take them as directed. Finish them even if you start to feel better.  If gauze is used, follow your caregiver's directions for changing the gauze.  To avoid spreading the infection:  Keep your draining abscess covered with a bandage.  Wash your hands well.  Do not share personal care items, towels, or whirlpools with others.  Avoid skin contact with others.  Keep your skin and clothes clean around the abscess.  Keep all follow-up appointments as directed by your caregiver. SEEK MEDICAL CARE IF:   You have increased pain, swelling, redness, fluid drainage, or bleeding.  You have muscle aches, chills, or a general ill feeling.  You have a fever. MAKE SURE YOU:   Understand these instructions.  Will watch your condition.  Will get help right away if you are not doing well or get worse. Document Released: 01/10/2005 Document Revised: 10/02/2011 Document Reviewed: 06/15/2011 [facility] Patient Information 2014 [place], [place].  Cellulitis Cellulitis is an infection of the skin and the tissue beneath it. The infected area is usually red and tender. Cellulitis occurs most often in the arms and lower legs.  CAUSES  Cellulitis is caused by bacteria that enter the skin through cracks or cuts in the skin. The most common types of bacteria that cause cellulitis are Staphylococcus and Streptococcus. SYMPTOMS  Redness and warmth.  Swelling.  Tenderness or pain.  Fever. DIAGNOSIS  Your caregiver can usually determine what is wrong based on a physical exam. Blood tests may also be done. TREATMENT  Treatment usually involves taking an antibiotic medicine. HOME CARE INSTRUCTIONS   Take your antibiotics as directed. Finish  them even if you start to feel better.  Keep the infected arm or leg elevated to reduce swelling.  Apply a warm cloth to the affected area up to 4 times per day to relieve pain.  Only take over-the-counter or prescription medicines for pain, discomfort, or fever as directed by your caregiver.  Keep all follow-up appointments as directed by your caregiver. SEEK MEDICAL CARE IF:   You notice red streaks coming from the infected area.  Your red area gets larger or turns dark in color.  Your bone or joint underneath the infected area becomes painful after the skin has healed.  Your infection returns in the same area or another area.  You notice a swollen bump in the infected area.  You develop new symptoms. SEEK IMMEDIATE MEDICAL CARE IF:   You have a fever.  You feel very sleepy.  You develop vomiting or diarrhea.  You have a general ill feeling (malaise) with muscle aches and pains. MAKE SURE YOU:   Understand these instructions.  Will watch your condition.  Will get help right away if you are not doing well or get worse. Document Released: 01/10/2005 Document Revised: 10/02/2011 Document Reviewed: 06/18/2011 [facility] Patient Information 2014 [place], [place].  Abscess Care After An abscess (also called a boil or furuncle) is an infected area that contains a collection of pus. Signs and symptoms of an abscess include pain, tenderness, redness, or hardness, or you may feel a moveable soft area under your skin. An abscess can occur anywhere in the body. The infection may spread to surrounding tissues causing cellulitis. A cut (incision) by the surgeon was made over your abscess and the pus was drained out. Gauze may have been packed into the space to provide a drain that will allow the cavity to heal from the inside outwards. The boil may be painful for 5 to 7 days. Most people with a boil do not have high fevers. Your abscess, if seen early, may not have localized, and may not  have been lanced. If not, another appointment may be required for this if it does not get better on its own or with medications. HOME CARE INSTRUCTIONS   Only take over-the-counter or prescription medicines for pain, discomfort, or fever as directed by your caregiver.  When you bathe, soak and then remove gauze or iodoform packs at least daily or as directed by your caregiver. You may then wash the wound gently with mild soapy water. Repack with gauze or do as your caregiver directs. SEEK IMMEDIATE MEDICAL CARE IF:   You develop increased pain, swelling, redness, drainage, or bleeding in the wound site.  You develop signs of generalized infection including muscle aches, chills, fever, or a general ill feeling.  An oral temperature above 102 F (38.9 C) develops, not controlled by medication. See your caregiver for a recheck if you develop any of the symptoms described above. If medications (antibiotics) were prescribed, take them as directed. Document Released: 10/19/2004 Document Revised: 06/25/2011 Document Reviewed: 06/16/2007 [facility] Patient Information 2014 [place], [place].

## 2013-09-08 NOTE — ED Notes (Signed)
RN went to discharge pt and pt's heart rate in the 120's. MD made aware. Tylenol given and will recheck heart rate later.

## 2013-09-08 NOTE — ED Notes (Signed)
Pt alert, arrives with caregiver from PCP, pt presents to have abscess looked at by MD, resp even unlabored

## 2013-09-08 NOTE — ED Provider Notes (Addendum)
TIME SEEN: 4:06 PM  CHIEF COMPLAINT: Left axillary abscess  HPI: Patient is a 20 are old male with a history of cerebral palsy, mental retardation, hypothyroidism, hyperlipidemia who lives at a group home who presents to the emergency department with a caregiver from the group home for evaluation for a left axillary abscess has been present for the past week. They state the patient has been on Bactrim since 5/21 without resolution. There's been drainage of the abscess. He was seen by his primary care physician who sent him to the emergency days he would need medication to help sedate him to have this I&D'ed.  ROS: Unobtainable secondary to patient's mental retardation  PAST MEDICAL HISTORY/PAST SURGICAL HISTORY:  Past Medical History  Diagnosis Date  . Reflux   . Thyroid disease   . Development delay   . GERD (gastroesophageal reflux disease)   . Seasonal allergies   . Hypercholesteremia   . Constipation   . Cerebral palsy   . Mental retardation     MEDICATIONS:  Prior to Admission medications   Medication Sig Start Date End Date Taking? Authorizing Provider  Amino Acids-Protein Hydrolys (FEEDING SUPPLEMENT, PRO-STAT SUGAR FREE 64,) LIQD Take 30 mLs by mouth daily. 07/01/13   Joseph Art, DO  clindamycin (CLEOCIN) 300 MG capsule Take 1 capsule (300 mg total) by mouth every 6 (six) hours. 07/01/13   Joseph Art, DO  collagenase (SANTYL) ointment Apply topically daily. 07/01/13   Joseph Art, DO  ENSURE PLUS (ENSURE PLUS) LIQD Take 237 mLs by mouth 3 (three) times daily between meals.    Historical Provider, MD  fluticasone (FLONASE) 50 MCG/ACT nasal spray Place 1 spray into both nostrils daily.    Historical Provider, MD  levothyroxine (SYNTHROID, LEVOTHROID) 50 MCG tablet Take 50 mcg by mouth daily.    Historical Provider, MD  loratadine (CLARITIN) 10 MG tablet Take 10 mg by mouth daily.    Historical Provider, MD  LORazepam (ATIVAN) 1 MG tablet Take 0.5-1 mg by mouth 2 (two)  times daily as needed for anxiety.     Historical Provider, MD  Multiple Vitamin (MULTIVITAMIN WITH MINERALS) TABS Take 1 tablet by mouth daily.    Historical Provider, MD  omeprazole (PRILOSEC) 20 MG capsule Take 20 mg by mouth 2 (two) times daily.     Historical Provider, MD  senna (SENOKOT) 8.6 MG TABS Take 4 tablets by mouth 2 (two) times daily.     Historical Provider, MD  simvastatin (ZOCOR) 20 MG tablet Take 20 mg by mouth every evening.    Historical Provider, MD  sucralfate (CARAFATE) 1 G tablet Take 1 g by mouth daily as needed.     Historical Provider, MD    ALLERGIES:  Allergies  Allergen Reactions  . Collard Greens Imagene Riches Virosa] Anaphylaxis  . Tomato Anaphylaxis  . Caffeine   . Chocolate   . Milk-Related Compounds     Iced Milk ONLY: Can have milk at room temperature only  . Phenothiazines     SOCIAL HISTORY:  History  Substance Use Topics  . Smoking status: Never Smoker   . Smokeless tobacco: Not on file  . Alcohol Use: No    FAMILY HISTORY: No family history on file.  EXAM: BP 127/67  Pulse 72  Temp(Src) 99.8 F (37.7 C) (Oral)  Resp 16  SpO2 99% CONSTITUTIONAL: Alert; patient is mostly nonverbal and only shouts out incomprehensible responses HEAD: Normocephalic EYES: Conjunctivae clear, PERRL ENT: normal nose; no rhinorrhea; moist mucous  membranes; pharynx without lesions noted NECK: Supple, no meningismus, no LAD CARD: RRR; S1 and S2 appreciated; no murmurs, no clicks, no rubs, no gallops RESP: Normal chest excursion without splinting or tachypnea; breath sounds clear and equal bilaterally; no wheezes, no rhonchi, no rales,  ABD/GI: Normal bowel sounds; non-distended; soft, non-tender, no rebound, no guarding BACK:  The back appears normal and is non-tender to palpation, there is no CVA tenderness EXT: Patient has a 3 x 4 cm erythematous fluctuant area to the left axilla with no drainage, 1 cm surrounding erythema and warmth, no joint effusion,  Normal ROM in all joints; otherwise extremities are non-tender to palpation; no edema; normal capillary refill; no cyanosis    SKIN: Normal color for age and race; warm NEURO: Moves all extremities equally; patient is mostly non-verbal at baseline, wheelchair-bound; patient appears agitated and is yelling out   MEDICAL DECISION MAKING: Patient here with low-grade fever an abscess to his left axilla despite being on antibiotics. No other infectious symptoms including cough, vomiting or diarrhea. Caregiver reports that he is at his mental baseline. We'll give IM Ativan and apply that to the abscess. Will I&D in the ED.  ED PROGRESS: Patient given Ativan as calm and cooperative. We were able to incise and drain his left axillary abscess without difficulty. He is currently on Bactrim for his cellulitis. We'll discharge him with supportive care surgeons and return precautions. Caregiver verbalizes understanding and is comfortable with plan.   7:43 PM  After I&D patient has been persistently tachycardic. Initially this was thought to to having a low-grade temperature but it has not improved with Tylenol or ibuprofen. Given his tachycardia and low-grade temperatures and the fact that he is nonverbal at baseline, will obtain basic labs, blood cultures and urine culture, EKG and give IV fluids.   9:30 PM  Pt's heart rate has improved with his temperature improvement and with IV fluids. His labs are unremarkable other than a mildly elevated lactate. We'll repeat his lactate after second liter of fluids. Anticipate if this is improving he could be discharged home on antibiotics.   10:45 PM  Pt's vital signs are stable his heart rate is still normal. His second lactate is improved after IV fluids. Suspect symptoms will improve now that he has had I&D of his abscess.  No other sign of infection on exam. Urine is clean. Patient's caregivers comfortable with this plan.   INCISION AND DRAINAGE Performed by:  Layla MawKristen N Ward Consent: Verbal consent obtained. Risks and benefits: risks, benefits and alternatives were discussed Type: abscess  Body area: Left axilla  Anesthesia: local infiltration  Incision was made with a scalpel.  Local anesthetic: lidocaine 2 % with epinephrine  Anesthetic total: 5 ml  Complexity: complex Blunt dissection to break up loculations  Drainage: purulent  Drainage amount: 5 MLS    Patient tolerance: Patient tolerated the procedure well with no immediate complications.    EKG Interpretation  Date/Time:  Tuesday Sep 08 2013 19:49:49 EDT Ventricular Rate:  127 PR Interval:  143 QRS Duration: 86 QT Interval:  315 QTC Calculation: 458 R Axis:   27 Text Interpretation:  Sinus tachycardia Posterior infarct, old No significant change since last tracing Confirmed by WARD,  DO, KRISTEN ([id number]) on 09/08/2013 8:12:34 PM          Layla MawKristen N Ward, DO 09/08/13 1808  Layla MawKristen N Ward, DO 09/08/13 2246

## 2013-09-08 NOTE — Progress Notes (Signed)
ANTIBIOTIC CONSULT NOTE - INITIAL  Pharmacy Consult for vancomycin Indication: cellulitis and associated abscess  Allergies  Allergen Reactions  . Collard Greens Imagene Riches Virosa] Anaphylaxis  . Tomato Anaphylaxis  . Caffeine   . Chocolate   . Milk-Related Compounds     Does ok with cake that has baked milk  . Phenothiazines     Patient Measurements: Wt: 56.7kg on 07/15/13 IBW: 57kg  Vital Signs: Temp: 99.3 F (37.4 C) (05/26 1910) Temp src: Oral (05/26 1910) BP: 106/67 mmHg (05/26 1830) Pulse Rate: 120 (05/26 1910)   Medical History: Past Medical History  Diagnosis Date  . Reflux   . Thyroid disease   . Development delay   . GERD (gastroesophageal reflux disease)   . Seasonal allergies   . Hypercholesteremia   . Constipation   . Cerebral palsy   . Mental retardation     Medications:  Scheduled:   Infusions:  . sodium chloride    . vancomycin     Assessment: 54 yoM admitted 5/26 with left axillary abscess that has been present x 1 week. PMHx significant for cerebral palsy, mental retardation, resides at group home.  Patient noted to have started course of bactrim on 5/21 without resolution. Pharmacy has been consulted to dose vancomycin for cellulitis and associated abscess. I&D was performed in the ED.  Antiinfectives 5/26 >> vancomycin >>  Labs / vitals Tmax: 100.1 WBCs: WNL (85% neutrophils) Renal: SCr 0.93 (baseline ~0.7), CrCl 62 ml/min CG, 79 ml/min normalized  Lactic acid: 2.79 (5/26)  Microbiology 5/26 blood x2: sent 5/26 urine: sent   Goal of Therapy:  Vancomycin trough level 10-15 mcg/ml  Plan:  - vancomycin 2g IV loading dose (given as 1g IOV doses x 2) - vancomycin 1500mg  IV q24h to start 5/27 at 2000 - vancomycin trough at steady state if indicated - follow-up clinical course, culture results, renal function - follow-up antibiotic de-escalation and length of therapy  Thank you for the consult.  Tomi Bamberger, PharmD, BCPS Pager:  [phone] Pharmacy: [phone] 09/08/2013 9:20 PM

## 2013-09-10 LAB — URINE CULTURE
CULTURE: NO GROWTH
Colony Count: NO GROWTH

## 2013-09-15 LAB — CULTURE, BLOOD (ROUTINE X 2)
Culture: NO GROWTH
Culture: NO GROWTH

## 2014-01-06 ENCOUNTER — Emergency Department (HOSPITAL_BASED_OUTPATIENT_CLINIC_OR_DEPARTMENT_OTHER)
Admission: EM | Admit: 2014-01-06 | Discharge: 2014-01-06 | Disposition: A | Discharge status: Home / Self Care | Payer: Medicare Other | Attending: Emergency Medicine | Admitting: Emergency Medicine

## 2014-01-06 ENCOUNTER — Encounter (HOSPITAL_BASED_OUTPATIENT_CLINIC_OR_DEPARTMENT_OTHER): Payer: Self-pay | Admitting: Emergency Medicine

## 2014-01-06 DIAGNOSIS — Z8709 Personal history of other diseases of the respiratory system: Secondary | ICD-10-CM | POA: Diagnosis not present

## 2014-01-06 DIAGNOSIS — Z79899 Other long term (current) drug therapy: Secondary | ICD-10-CM | POA: Diagnosis not present

## 2014-01-06 DIAGNOSIS — L0231 Cutaneous abscess of buttock: Secondary | ICD-10-CM

## 2014-01-06 DIAGNOSIS — L03317 Cellulitis of buttock: Principal | ICD-10-CM

## 2014-01-06 DIAGNOSIS — L089 Local infection of the skin and subcutaneous tissue, unspecified: Secondary | ICD-10-CM | POA: Diagnosis present

## 2014-01-06 DIAGNOSIS — Z8669 Personal history of other diseases of the nervous system and sense organs: Secondary | ICD-10-CM | POA: Insufficient documentation

## 2014-01-06 DIAGNOSIS — K219 Gastro-esophageal reflux disease without esophagitis: Secondary | ICD-10-CM | POA: Insufficient documentation

## 2014-01-06 DIAGNOSIS — IMO0002 Reserved for concepts with insufficient information to code with codable children: Secondary | ICD-10-CM | POA: Insufficient documentation

## 2014-01-06 DIAGNOSIS — E7800 Pure hypercholesterolemia, unspecified: Secondary | ICD-10-CM | POA: Diagnosis not present

## 2014-01-06 MED ORDER — CLINDAMYCIN HCL 150 MG PO CAPS: 300.0000 mg | ORAL_CAPSULE | Freq: Three times a day (TID) | ORAL | Status: AC

## 2014-01-06 MED ORDER — PRO-STAT SUGAR FREE PO LIQD: 30.0000 mL | ORAL | Status: AC

## 2014-01-06 MED ORDER — PENTAFLUOROPROP-TETRAFLUOROETH EX AERO
INHALATION_SPRAY | Freq: Once | CUTANEOUS | Status: AC
  Administered 2014-01-06: 13:00:00 via TOPICAL

## 2014-01-06 MED ORDER — PENTAFLUOROPROP-TETRAFLUOROETH EX AERO
INHALATION_SPRAY | CUTANEOUS | Status: AC
  Filled 2014-01-06: qty 30

## 2014-01-06 NOTE — ED Notes (Signed)
MD at bedside. 

## 2014-01-06 NOTE — ED Notes (Signed)
Pt to room 10 with caregiver, reporting recurrent boil to between his buttocks, this one since Friday. No fevers.

## 2014-01-06 NOTE — Discharge Instructions (Signed)

## 2014-01-06 NOTE — ED Provider Notes (Signed)
CSN: [id number]     Arrival date & time 01/06/14  0935 History   First MD Initiated Contact with Patient 01/06/14 1054     Chief Complaint  Patient presents with  . Recurrent Skin Infections     (Consider location/radiation/quality/duration/timing/severity/associated sxs/prior Treatment) Patient is a 68 y.o. male presenting with abscess.  Abscess Location:  Ano-genital Ano-genital abscess location:  L buttock Size:  3cm Abscess quality: draining, fluctuance and redness   Duration:  5 days Progression:  Worsening Chronicity:  Recurrent Context comment:  Recurrent abscesses Relieved by:  Nothing Associated symptoms: no fatigue and no fever     Past Medical History  Diagnosis Date  . Reflux   . Thyroid disease   . Development delay   . GERD (gastroesophageal reflux disease)   . Seasonal allergies   . Hypercholesteremia   . Constipation   . Cerebral palsy   . Mental retardation    Past Surgical History  Procedure Laterality Date  . Cystoscopy  11/2009  . Circumcision N/A 11/13/2012    Procedure: CIRCUMCISION ADULT;  Surgeon: Connor Barban, MD;  Location: [facility];  Service: Urology;  Laterality: N/A;   History reviewed. No pertinent family history. History  Substance Use Topics  . Smoking status: Never Smoker   . Smokeless tobacco: Not on file  . Alcohol Use: No    Review of Systems  Unable to perform ROS: Patient nonverbal  Constitutional: Negative for fever and fatigue.      Allergies  Collard greens; Tomato; Caffeine; Chocolate; Milk-related compounds; and Phenothiazines  Home Medications   Prior to Admission medications   Medication Sig Start Date End Date Taking? Authorizing Provider  acetaminophen (TYLENOL) 325 MG tablet Take 2 tablets (650 mg total) by mouth every 6 (six) hours as needed. 09/08/13   Kristen N Ward, DO  Amino Acids-Protein Hydrolys (FEEDING SUPPLEMENT, PRO-STAT SUGAR FREE 64,) LIQD Take 30 mLs by mouth daily. 07/01/13   Joseph Art, DO  chlorhexidine (HIBICLENS) 4 % external liquid Apply 1 application topically daily as needed.    Historical Provider, MD  fluticasone (FLONASE) 50 MCG/ACT nasal spray Place 1 spray into both nostrils daily.    Historical Provider, MD  levothyroxine (SYNTHROID, LEVOTHROID) 50 MCG tablet Take 50 mcg by mouth daily.    Historical Provider, MD  loratadine (CLARITIN) 10 MG tablet Take 10 mg by mouth at bedtime.     Historical Provider, MD  LORazepam (ATIVAN) 1 MG tablet Take 0.5-1 mg by mouth 2 (two) times daily as needed for anxiety.     Historical Provider, MD  Multiple Vitamin (MULTIVITAMIN WITH MINERALS) TABS Take 1 tablet by mouth daily.    Historical Provider, MD  omeprazole (PRILOSEC) 20 MG capsule Take 20 mg by mouth 2 (two) times daily.     Historical Provider, MD  senna (SENOKOT) 8.6 MG TABS Take 4 tablets by mouth 2 (two) times daily.     Historical Provider, MD  simvastatin (ZOCOR) 20 MG tablet Take 20 mg by mouth every evening.    Historical Provider, MD  Sulfamethoxazole-Trimethoprim 800-160 MG/20ML SUSP Take 20 mLs by mouth 2 (two) times daily.    Historical Provider, MD   BP 144/71  Pulse 115  Temp(Src) 97.9 F (36.6 C) (Axillary)  SpO2 100% Physical Exam  Nursing note and vitals reviewed. Constitutional: He is oriented to person, place, and time. He appears well-developed and well-nourished. No distress.  HENT:  Head: Normocephalic and atraumatic.  Eyes: Conjunctivae are normal. No  scleral icterus.  Neck: Neck supple.  Cardiovascular: Normal rate and intact distal pulses.   Pulmonary/Chest: Effort normal. No stridor. No respiratory distress.  Abdominal: Normal appearance. He exhibits no distension.  Genitourinary:     Neurological: He is alert and oriented to person, place, and time.  Skin: Skin is warm and dry. No rash noted.  Psychiatric: He has a normal mood and affect. His behavior is normal.    ED Course  INCISION AND DRAINAGE Date/Time: 01/06/2014 12:48  PM Performed by: Blake Divine DAVID III Authorized by: Blake Divine DAVID III Consent: Verbal consent obtained. Risks and benefits: risks, benefits and alternatives were discussed Consent given by: caregiver. Type: abscess Body area: anogenital (left buttock) Local anesthetic: lidocaine 2% with epinephrine Scalpel size: 11 Incision type: single straight Complexity: simple Drainage: purulent Drainage amount: moderate Wound treatment: wound left open Packing material: 1/4 in iodoform gauze Patient tolerance: Patient tolerated the procedure well with no immediate complications.   (including critical care time) Labs Review Labs Reviewed - No data to display  Imaging Review No results found.   EKG Interpretation None      MDM   Final diagnoses:  Left buttock abscess    67 year old male with history of MR who presents with a left buttock abscess. No signs of rectal involvement. Drained without difficulty. Caregiver requests refill for his feeding supplement. He has had recurrent abscesses in this area which is in the past been severe, so we'll put on clindamycin.    Candyce Churn III, MD 01/06/14 1250

## 2014-01-06 NOTE — ED Notes (Signed)
Patient preparing for discharge.  EMT assisted patient's home healthcare worker to put patient in patient's wheelchair.

## 2014-04-29 ENCOUNTER — Encounter (HOSPITAL_COMMUNITY): Payer: Self-pay | Admitting: Urology

## 2014-06-10 ENCOUNTER — Emergency Department (HOSPITAL_BASED_OUTPATIENT_CLINIC_OR_DEPARTMENT_OTHER): Payer: Medicare Other

## 2014-06-10 ENCOUNTER — Encounter (HOSPITAL_BASED_OUTPATIENT_CLINIC_OR_DEPARTMENT_OTHER): Payer: Self-pay | Admitting: *Deleted

## 2014-06-10 ENCOUNTER — Emergency Department (HOSPITAL_BASED_OUTPATIENT_CLINIC_OR_DEPARTMENT_OTHER)
Admission: EM | Admit: 2014-06-10 | Discharge: 2014-06-10 | Disposition: A | Discharge status: Home / Self Care | Payer: Medicare Other | Attending: Emergency Medicine | Admitting: Emergency Medicine

## 2014-06-10 DIAGNOSIS — Z79899 Other long term (current) drug therapy: Secondary | ICD-10-CM | POA: Insufficient documentation

## 2014-06-10 DIAGNOSIS — N50811 Right testicular pain: Secondary | ICD-10-CM

## 2014-06-10 DIAGNOSIS — Q02 Microcephaly: Secondary | ICD-10-CM | POA: Insufficient documentation

## 2014-06-10 DIAGNOSIS — K219 Gastro-esophageal reflux disease without esophagitis: Secondary | ICD-10-CM | POA: Insufficient documentation

## 2014-06-10 DIAGNOSIS — E079 Disorder of thyroid, unspecified: Secondary | ICD-10-CM | POA: Diagnosis not present

## 2014-06-10 DIAGNOSIS — Z7951 Long term (current) use of inhaled steroids: Secondary | ICD-10-CM | POA: Diagnosis not present

## 2014-06-10 DIAGNOSIS — N453 Epididymo-orchitis: Secondary | ICD-10-CM | POA: Insufficient documentation

## 2014-06-10 DIAGNOSIS — R2241 Localized swelling, mass and lump, right lower limb: Secondary | ICD-10-CM | POA: Diagnosis present

## 2014-06-10 DIAGNOSIS — Z8739 Personal history of other diseases of the musculoskeletal system and connective tissue: Secondary | ICD-10-CM | POA: Diagnosis not present

## 2014-06-10 DIAGNOSIS — Z792 Long term (current) use of antibiotics: Secondary | ICD-10-CM | POA: Insufficient documentation

## 2014-06-10 DIAGNOSIS — Z8669 Personal history of other diseases of the nervous system and sense organs: Secondary | ICD-10-CM | POA: Insufficient documentation

## 2014-06-10 DIAGNOSIS — Z8659 Personal history of other mental and behavioral disorders: Secondary | ICD-10-CM | POA: Diagnosis not present

## 2014-06-10 HISTORY — DX: Ulcer of esophagus without bleeding: K22.10

## 2014-06-10 HISTORY — DX: Other specified paralytic syndromes: G83.89

## 2014-06-10 HISTORY — DX: Scoliosis, unspecified: M41.9

## 2014-06-10 HISTORY — DX: Microcephaly: Q02

## 2014-06-10 HISTORY — DX: Allergic rhinitis, unspecified: J30.9

## 2014-06-10 MED ORDER — HYDROMORPHONE HCL 1 MG/ML IJ SOLN
1.0000 mg | Freq: Once | INTRAMUSCULAR | Status: AC
Start: 2014-06-10 — End: 2014-06-10
  Administered 2014-06-10: 1 mg via INTRAMUSCULAR
  Filled 2014-06-10: qty 1

## 2014-06-10 MED ORDER — ONDANSETRON 8 MG PO TBDP
8.0000 mg | ORAL_TABLET | Freq: Once | ORAL | Status: AC
  Administered 2014-06-10: 8 mg via ORAL
  Filled 2014-06-10: qty 1

## 2014-06-10 MED ORDER — HYDROCODONE-ACETAMINOPHEN 5-325 MG PO TABS: 1.0000 | ORAL_TABLET | Freq: Four times a day (QID) | ORAL | Status: AC | PRN

## 2014-06-10 MED ORDER — CEFDINIR 300 MG PO CAPS: 300.0000 mg | ORAL_CAPSULE | Freq: Two times a day (BID) | ORAL | Status: AC

## 2014-06-10 NOTE — ED Notes (Signed)
MD at bedside. 

## 2014-06-10 NOTE — ED Provider Notes (Signed)
CSN: [id number]     Arrival date & time 06/10/14  4098 History   First MD Initiated Contact with Patient 06/10/14 0945     Chief Complaint  Patient presents with  . Groin Swelling     (Consider location/radiation/quality/duration/timing/severity/associated sxs/prior Treatment) HPI Comments: Patient presents to the ER for evaluation of testicle pain. Patient was first noted to be slightly agitated and not wanting to eat yesterday during the day. Last night he was noted to have swelling of his right testicle. He has been complaining of pain in the swelling is worse today. No known injury. Patient has profound MR, therefore Level V Caveat.    Past Medical History  Diagnosis Date  . Reflux   . Thyroid disease   . Development delay   . GERD (gastroesophageal reflux disease)   . Seasonal allergies   . Hypercholesteremia   . Constipation   . Cerebral palsy   . Mental retardation   . Microcephaly   . Kyphoscoliosis   . Allergic rhinitis   . Ulcerative esophagitis   . Spastic triplegia    Past Surgical History  Procedure Laterality Date  . Cystoscopy  11/2009  . Circumcision N/A 11/13/2012    Procedure: CIRCUMCISION ADULT;  Surgeon: Ky Barban, MD;  Location: [facility];  Service: Urology;  Laterality: N/A;   No family history on file. History  Substance Use Topics  . Smoking status: Never Smoker   . Smokeless tobacco: Not on file  . Alcohol Use: No    Review of Systems  Unable to perform ROS: Patient nonverbal      Allergies  Collard greens; Tomato; Caffeine; Chocolate; Milk-related compounds; and Phenothiazines  Home Medications   Prior to Admission medications   Medication Sig Start Date End Date Taking? Authorizing Provider  acetaminophen (TYLENOL) 325 MG tablet Take 2 tablets (650 mg total) by mouth every 6 (six) hours as needed. 09/08/13  Yes Kristen N Ward, DO  Amino Acids-Protein Hydrolys (FEEDING SUPPLEMENT, PRO-STAT SUGAR FREE 64,) LIQD Take 30 mLs by  mouth daily. 01/06/14  Yes Candyce Churn III, MD  chlorhexidine (HIBICLENS) 4 % external liquid Apply 1 application topically daily as needed.   Yes Historical Provider, MD  fluticasone (FLONASE) 50 MCG/ACT nasal spray Place 1 spray into both nostrils daily.   Yes Historical Provider, MD  levothyroxine (SYNTHROID, LEVOTHROID) 50 MCG tablet Take 50 mcg by mouth daily.   Yes Historical Provider, MD  loratadine (CLARITIN) 10 MG tablet Take 10 mg by mouth at bedtime.    Yes Historical Provider, MD  LORazepam (ATIVAN) 1 MG tablet Take 0.5-1 mg by mouth 2 (two) times daily as needed for anxiety.    Yes Historical Provider, MD  Multiple Vitamin (MULTIVITAMIN WITH MINERALS) TABS Take 1 tablet by mouth daily.   Yes Historical Provider, MD  omeprazole (PRILOSEC) 20 MG capsule Take 20 mg by mouth 2 (two) times daily.    Yes Historical Provider, MD  senna (SENOKOT) 8.6 MG TABS Take 4 tablets by mouth 2 (two) times daily.    Yes Historical Provider, MD  simvastatin (ZOCOR) 20 MG tablet Take 20 mg by mouth every evening.   Yes Historical Provider, MD  cefdinir (OMNICEF) 300 MG capsule Take 1 capsule (300 mg total) by mouth 2 (two) times daily. 06/10/14   Gilda Crease, MD  clindamycin (CLEOCIN) 150 MG capsule Take 2 capsules (300 mg total) by mouth 3 (three) times daily. 01/06/14   Merrie Roof, MD  HYDROcodone-acetaminophen (  NORCO/VICODIN) 5-325 MG per tablet Take 1 tablet by mouth every 6 (six) hours as needed for moderate pain. 06/10/14   Gilda Crease, MD  Sulfamethoxazole-Trimethoprim 800-160 MG/20ML SUSP Take 20 mLs by mouth 2 (two) times daily.    Historical Provider, MD   BP 106/87 mmHg  Pulse 120  Temp(Src) 100.4 F (38 C) (Rectal)  Resp 22  SpO2 100% Physical Exam  Constitutional: He is oriented to person, place, and time. He appears well-developed and well-nourished. No distress.  HENT:  Head: Normocephalic and atraumatic.  Right Ear: Hearing normal.  Left Ear:  Hearing normal.  Nose: Nose normal.  Mouth/Throat: Oropharynx is clear and moist and mucous membranes are normal.  Eyes: Conjunctivae and EOM are normal. Pupils are equal, round, and reactive to light.  Neck: Normal range of motion. Neck supple.  Cardiovascular: Regular rhythm, S1 normal and S2 normal.  Exam reveals no gallop and no friction rub.   No murmur heard. Pulmonary/Chest: Effort normal and breath sounds normal. No respiratory distress. He exhibits no tenderness.  Abdominal: Soft. Normal appearance and bowel sounds are normal. There is no hepatosplenomegaly. There is no tenderness. There is no rebound, no guarding, no tenderness at McBurney's point and negative Murphy's sign. No hernia. Hernia confirmed negative in the right inguinal area and confirmed negative in the left inguinal area.  Genitourinary: Penis normal. Right testis shows swelling and tenderness. Circumcised.  Musculoskeletal: Normal range of motion.  Neurological: He is alert and oriented to person, place, and time. He has normal strength. No cranial nerve deficit or sensory deficit. Coordination normal. GCS eye subscore is 4. GCS verbal subscore is 5. GCS motor subscore is 6.  Skin: Skin is warm, dry and intact. No rash noted. No cyanosis.  Psychiatric: He has a normal mood and affect. His speech is normal and behavior is normal. Thought content normal.  Nursing note and vitals reviewed.   ED Course  Procedures (including critical care time) Labs Review Labs Reviewed - No data to display  Imaging Review US Scrotum  06/10/2014   CLINICAL DATA:  Right scrotal pain and swelling for 1 day  EXAM: SCROTAL ULTRASOUND  DOPPLER ULTRASOUND OF THE TESTICLES  TECHNIQUE: Complete ultrasound examination of the testicles, epididymis, and other scrotal structures was performed. Color and spectral Doppler ultrasound were also utilized to evaluate blood flow to the testicles.  COMPARISON:  None.  FINDINGS: Right testicle  Measurements:  3.3 x 1.5 x 1.6 cm. No mass or microlithiasis visualized. The echotexture of the testis is mildly heterogeneous.  Left testicle  Measurements: 3.2 x 1.0 x 1.4 cm. No mass or microlithiasis visualized. The echotexture of the testis is mildly heterogeneous.  Right epididymis: The right epididymis is diffusely enlarged an extremely hypervascular. There is no well-defined mass in this area.  Left epididymis: The left epididymis is moderately enlarged and diffusely hypervascular. There is no well-defined mass in this area.  Hydrocele: There is a septated hydrocele on the right, moderate. There is no appreciable left-sided hydrocele.  Varicocele:  None visualized.  Pulsed Doppler interrogation of both testes demonstrates normal low resistance arterial and venous waveforms bilaterally. The right testis is hypervascular. The peak systolic velocity on the right is 7 cm/sec. On the left, the peak systolic velocity is 4 cm/sec.  There is no frank abscess seen. There is mild scrotal wall thickening diffusely.  IMPRESSION: Findings consistent with epididymitis and orchitis bilaterally, more severe on the right than on the left. Both epididymal structures are diffusely  enlarged and markedly hypervascular. The testes are somewhat less dramatically hypervascular. There is a complex hydrocele on the right, felt to be of inflammatory etiology. Mild scrotal wall thickening is likewise felt to be due to inflammatory etiology. There is no intratesticular mass or torsion seen.   Electronically Signed   By: Bretta BangWilliam  Woodruff III M.D.   On: 06/10/2014 12:25   [place] Art/ven Flow Abd Pelv Doppler  06/10/2014   CLINICAL DATA:  Right scrotal pain and swelling for 1 day  EXAM: SCROTAL ULTRASOUND  DOPPLER ULTRASOUND OF THE TESTICLES  TECHNIQUE: Complete ultrasound examination of the testicles, epididymis, and other scrotal structures was performed. Color and spectral Doppler ultrasound were also utilized to evaluate blood flow to the testicles.   COMPARISON:  None.  FINDINGS: Right testicle  Measurements: 3.3 x 1.5 x 1.6 cm. No mass or microlithiasis visualized. The echotexture of the testis is mildly heterogeneous.  Left testicle  Measurements: 3.2 x 1.0 x 1.4 cm. No mass or microlithiasis visualized. The echotexture of the testis is mildly heterogeneous.  Right epididymis: The right epididymis is diffusely enlarged an extremely hypervascular. There is no well-defined mass in this area.  Left epididymis: The left epididymis is moderately enlarged and diffusely hypervascular. There is no well-defined mass in this area.  Hydrocele: There is a septated hydrocele on the right, moderate. There is no appreciable left-sided hydrocele.  Varicocele:  None visualized.  Pulsed Doppler interrogation of both testes demonstrates normal low resistance arterial and venous waveforms bilaterally. The right testis is hypervascular. The peak systolic velocity on the right is 7 cm/sec. On the left, the peak systolic velocity is 4 cm/sec.  There is no frank abscess seen. There is mild scrotal wall thickening diffusely.  IMPRESSION: Findings consistent with epididymitis and orchitis bilaterally, more severe on the right than on the left. Both epididymal structures are diffusely enlarged and markedly hypervascular. The testes are somewhat less dramatically hypervascular. There is a complex hydrocele on the right, felt to be of inflammatory etiology. Mild scrotal wall thickening is likewise felt to be due to inflammatory etiology. There is no intratesticular mass or torsion seen.   Electronically Signed   By: Bretta BangWilliam  Woodruff III M.D.   On: 06/10/2014 12:25     EKG Interpretation None      MDM   Final diagnoses:  Pain in right testicle  Epididymo-orchitis, acute    Patient presents to the ER for evaluation of testicular swelling. Patient has been complaining of pain in his in obvious distress when his right testicle is palpated. Examination revealed significant  swelling of the testicle. There has been no known injury. Ultrasound with Doppler reveals normal blood flow, evidence of epididymal orchitis. Patient will be treated with Omnicef, analgesia, follow up with urology.    Gilda Creasehristopher J. Pollina, MD 06/10/14 1341

## 2014-06-10 NOTE — ED Notes (Signed)
Pt from group home- caregiver noticed testicular swelling yesterday afternoon- pt has hx of MR

## 2014-06-10 NOTE — ED Notes (Signed)
Patient transported to Ultrasound 

## 2014-06-10 NOTE — Discharge Instructions (Signed)
Epididymitis Epididymitis is a swelling (inflammation) of the epididymis. The epididymis is a cord-like structure along the back part of the testicle. Epididymitis is usually, but not always, caused by infection. This is usually a sudden problem beginning with chills, fever and pain behind the scrotum and in the testicle. There may be swelling and redness of the testicle. DIAGNOSIS  Physical examination will reveal a tender, swollen epididymis. Sometimes, cultures are obtained from the urine or from prostate secretions to help find out if there is an infection or if the cause is a different problem. Sometimes, blood work is performed to see if your white blood cell count is elevated and if a germ (bacterial) or viral infection is present. Using this knowledge, an appropriate medicine which kills germs (antibiotic) can be chosen by your caregiver. A viral infection causing epididymitis will most often go away (resolve) without treatment. HOME CARE INSTRUCTIONS   Hot sitz baths for 20 minutes, 4 times per day, may help relieve pain.  Only take over-the-counter or prescription medicines for pain, discomfort or fever as directed by your caregiver.  Take all medicines, including antibiotics, as directed. Take the antibiotics for the full prescribed length of time even if you are feeling better.  It is very important to keep all follow-up appointments. SEEK IMMEDIATE MEDICAL CARE IF:   You have a fever.  You have pain not relieved with medicines.  You have any worsening of your problems.  Your pain seems to come and go.  You develop pain, redness, and swelling in the scrotum and surrounding areas. MAKE SURE YOU:   Understand these instructions.  Will watch your condition.  Will get help right away if you are not doing well or get worse. Document Released: 03/30/2000 Document Revised: 06/25/2011 Document Reviewed: 02/17/2009 [facility] Patient Information 2015 [place], [place]. This information  is not intended to replace advice given to you by your health care provider. Make sure you discuss any questions you have with your health care provider.  Orchitis Orchitis is an infection of the testicle of usually sudden onset (happens quickly). It may be viral or bacterial (caused by germs). Usually with this illness there is generalized malaise (not feeling well) and fever. There is also pain. There is usually tenderness and swelling of the scrotum and testicle. DIAGNOSIS  Your caregiver will perform an exam to make sure there is not another reason for the pain in your testicle. A rectal exam may be done to find out if the prostate is swollen and tender. Blood work may be done to see if your white blood cell count is elevated. This can help determine if an infection is viral or bacterial. A urinalysis can also determine what type of infection is present. Most bacterial infections can be treated with antibiotics (medications which kill germs). LET YOUR CAREGIVER KNOW ABOUT:  Allergies.  Medications taken including herbs, eye drops, over the counter medications, and creams.  Use of steroids (by mouth or creams).  Previous problems with anesthetics or novocaine.  Previous prostate infections.  History of blood clots (thrombophlebitis).  History of bleeding or blood problems.  Previous surgery.  Previous urinary tract infection.  Other health problems. HOME CARE INSTRUCTIONS   Apply cold packs to the scrotal area for twenty minutes, four times per day or as needed.  A scrotal support may be helpful. Keep a small pillow or support under your testicles while lying or sitting down.  Only take over-the-counter or prescription medicines for pain, discomfort, or fever  as directed by your caregiver.  Take all medications, including antibiotics, as directed. Take the antibiotics for the full prescribed length of time even if you are feeling better. SEEK IMMEDIATE MEDICAL CARE IF:   Your  redness, swelling, or pain in the testicle increases or is not getting better.  You have a fever.  You have pain not relieved with medicines.  You have any worsening of any symptoms (problems) that originally brought you in for medical care. Document Released: 03/30/2000 Document Revised: 06/25/2011 Document Reviewed: 04/02/2005 [facility] Patient Information 2015 [place], [place]. This information is not intended to replace advice given to you by your health care provider. Make sure you discuss any questions you have with your health care provider.

## 2014-06-10 NOTE — ED Notes (Addendum)
D/c back to group home with caregiver- rx x2 given for omnicef and norco- f/u with urology discussed with caregiver

## 2015-03-01 ENCOUNTER — Ambulatory Visit: Payer: Medicare Other | Attending: Nurse Practitioner | Admitting: Physical Therapy

## 2015-03-01 DIAGNOSIS — G808 Other cerebral palsy: Secondary | ICD-10-CM

## 2015-03-02 ENCOUNTER — Encounter: Payer: Self-pay | Admitting: Physical Therapy

## 2015-03-02 NOTE — Therapy (Signed)
[facility] [address] [place], [place], [address] Phone: [phone]   Fax:  [phone]  Physical Therapy Evaluation  Patient Details  Name: Connor Todd MRN: [id number] Date of Birth: 03/12/1946 No Data Recorded  Encounter Date: 03/01/2015      PT End of Session - 03/02/15 1429    Visit Number 1   Number of Visits 1   Authorization Type Medicare   PT Start Time 1315   PT Stop Time 1430   PT Time Calculation (min) 75 min      Past Medical History  Diagnosis Date  . Reflux   . Thyroid disease   . Development delay   . GERD (gastroesophageal reflux disease)   . Seasonal allergies   . Hypercholesteremia   . Constipation   . Cerebral palsy (HCC)   . Mental retardation   . Microcephaly (HCC)   . Kyphoscoliosis   . Allergic rhinitis   . Ulcerative esophagitis   . Spastic triplegia [facility](HCC)     Past Surgical History  Procedure Laterality Date  . Cystoscopy  11/2009  . Circumcision N/A 11/13/2012    Procedure: CIRCUMCISION ADULT;  Surgeon: Ky BarbanMohammad I Javaid, MD;  Location: [facility];  Service: Urology;  Laterality: N/A;    There were no vitals filed for this visit.  Visit Diagnosis:  Spastic triplegia, congenital (HCC) - Plan: PT plan of care cert/re-cert      manual wheelchair eval completed with Josh Cadle, ATP, [facility]: recommend [employer] tilt in space manual w/c with dynamic rocker back and footplates due to Excessive extensor tone    Complete LMN to be completed                                Plan - 03/02/15 1430    Clinical Impression Statement manual tilt in space wheelchair evaluation - [facility] Josh Cadle, ATP [facility]   PT Frequency Other (comment)  eval only   Consulted and Agree with Plan of Care Other (Comment)  caregiver [facility]ula [facility]re[facility]essa [facility]          G-Codes - 03/01/15 1432    Functional Assessment Tool Used dependent for  mobiltiy - manual wheelchair eval completed   Functional Limitation Mobility: Walking and moving around   Mobility: Walking and Moving Around Current Status [phone]([id number]) At least 80 percent but less than 100 percent impaired, limited or restricted   Mobility: Walking and Moving Around Goal Status [phone]([id number]) At least 80 percent but less than 100 percent impaired, limited or restricted   Mobility: Walking and Moving Around Discharge Status [phone]([id number]) At least 80 percent but less than 100 percent impaired, limited or restricted       Problem List Patient Active Problem List   Diagnosis Date Noted  . Cellulitis and abscess of buttock 06/27/2013  . Mental retardation 06/27/2013  . Hypothyroidism 06/27/2013  . GERD (gastroesophageal reflux disease) 06/27/2013    [name], Donavan BurnetLinda Suzanne, PT 03/02/2015, 2:41 PM  [facility] [address] [place], [place], [address] Phone: [phone]   Fax:  [phone]  Name: Connor Todd MRN: [id number] Date of Birth: 11/05/1945

## 2015-03-08 ENCOUNTER — Emergency Department (HOSPITAL_COMMUNITY): Payer: Medicare Other

## 2015-03-08 ENCOUNTER — Emergency Department (HOSPITAL_COMMUNITY)
Admission: EM | Admit: 2015-03-08 | Discharge: 2015-03-08 | Disposition: A | Discharge status: Home / Self Care | Payer: Medicare Other | Attending: Emergency Medicine | Admitting: Emergency Medicine

## 2015-03-08 ENCOUNTER — Encounter (HOSPITAL_COMMUNITY): Payer: Self-pay | Admitting: *Deleted

## 2015-03-08 DIAGNOSIS — Z792 Long term (current) use of antibiotics: Secondary | ICD-10-CM | POA: Insufficient documentation

## 2015-03-08 DIAGNOSIS — K5900 Constipation, unspecified: Secondary | ICD-10-CM | POA: Insufficient documentation

## 2015-03-08 DIAGNOSIS — Z7951 Long term (current) use of inhaled steroids: Secondary | ICD-10-CM | POA: Insufficient documentation

## 2015-03-08 DIAGNOSIS — R000 Tachycardia, unspecified: Secondary | ICD-10-CM | POA: Diagnosis not present

## 2015-03-08 DIAGNOSIS — E7800 Pure hypercholesterolemia, unspecified: Secondary | ICD-10-CM | POA: Insufficient documentation

## 2015-03-08 DIAGNOSIS — Z79899 Other long term (current) drug therapy: Secondary | ICD-10-CM | POA: Insufficient documentation

## 2015-03-08 DIAGNOSIS — Z8659 Personal history of other mental and behavioral disorders: Secondary | ICD-10-CM | POA: Insufficient documentation

## 2015-03-08 DIAGNOSIS — M25561 Pain in right knee: Secondary | ICD-10-CM

## 2015-03-08 DIAGNOSIS — R609 Edema, unspecified: Secondary | ICD-10-CM

## 2015-03-08 DIAGNOSIS — M25461 Effusion, right knee: Secondary | ICD-10-CM | POA: Insufficient documentation

## 2015-03-08 DIAGNOSIS — K219 Gastro-esophageal reflux disease without esophagitis: Secondary | ICD-10-CM | POA: Insufficient documentation

## 2015-03-08 DIAGNOSIS — Q02 Microcephaly: Secondary | ICD-10-CM | POA: Diagnosis not present

## 2015-03-08 DIAGNOSIS — E079 Disorder of thyroid, unspecified: Secondary | ICD-10-CM | POA: Diagnosis not present

## 2015-03-08 DIAGNOSIS — Z8669 Personal history of other diseases of the nervous system and sense organs: Secondary | ICD-10-CM | POA: Insufficient documentation

## 2015-03-08 LAB — CBC WITH DIFFERENTIAL/PLATELET
BASOS PCT: 0 %
Basophils Absolute: 0 10*3/uL (ref 0.0–0.1)
EOS ABS: 0 10*3/uL (ref 0.0–0.7)
Eosinophils Relative: 0 %
HEMATOCRIT: 40.7 % (ref 39.0–52.0)
HEMOGLOBIN: 13.2 g/dL (ref 13.0–17.0)
Lymphocytes Relative: 9 %
Lymphs Abs: 0.9 10*3/uL (ref 0.7–4.0)
MCH: 30 pg (ref 26.0–34.0)
MCHC: 32.4 g/dL (ref 30.0–36.0)
MCV: 92.5 fL (ref 78.0–100.0)
Monocytes Absolute: 1.7 10*3/uL — ABNORMAL HIGH (ref 0.1–1.0)
Monocytes Relative: 17 %
NEUTROS ABS: 7.4 10*3/uL (ref 1.7–7.7)
NEUTROS PCT: 74 %
Platelets: 211 10*3/uL (ref 150–400)
RBC: 4.4 MIL/uL (ref 4.22–5.81)
RDW: 15.8 % — AB (ref 11.5–15.5)
WBC: 10 10*3/uL (ref 4.0–10.5)

## 2015-03-08 LAB — BASIC METABOLIC PANEL
ANION GAP: 9 (ref 5–15)
BUN: 19 mg/dL (ref 6–20)
CO2: 24 mmol/L (ref 22–32)
Calcium: 9.6 mg/dL (ref 8.9–10.3)
Chloride: 107 mmol/L (ref 101–111)
Creatinine, Ser: 0.67 mg/dL (ref 0.61–1.24)
GFR calc non Af Amer: >60 mL/min (ref >60)
Glucose, Bld: 113 mg/dL — ABNORMAL HIGH (ref 65–99)
Potassium: 4.2 mmol/L (ref 3.5–5.1)
SODIUM: 140 mmol/L (ref 135–145)

## 2015-03-08 LAB — I-STAT CG4 LACTIC ACID, ED
LACTIC ACID, VENOUS: 1.83 mmol/L (ref 0.5–2.0)
Lactic Acid, Venous: 2.04 mmol/L (ref 0.5–2.0)

## 2015-03-08 LAB — SEDIMENTATION RATE: SED RATE: 72 mm/h — AB (ref 0–16)

## 2015-03-08 LAB — C-REACTIVE PROTEIN: CRP: 5.8 mg/dL — AB (ref <1.0)

## 2015-03-08 MED ORDER — ACETAMINOPHEN 325 MG PO TABS
650.0000 mg | ORAL_TABLET | Freq: Once | ORAL | Status: AC
  Administered 2015-03-08: 650 mg via ORAL
  Filled 2015-03-08: qty 2

## 2015-03-08 MED ORDER — PREDNISONE 10 MG (21) PO TBPK: 10.0000 mg | ORAL_TABLET | Freq: Every day | ORAL | Status: AC

## 2015-03-08 MED ORDER — FENTANYL CITRATE (PF) 100 MCG/2ML IJ SOLN
50.0000 ug | Freq: Once | INTRAMUSCULAR | Status: AC
  Administered 2015-03-08: 50 ug via INTRAVENOUS
  Filled 2015-03-08: qty 2

## 2015-03-08 MED ORDER — LIDOCAINE-EPINEPHRINE 1 %-1:100000 IJ SOLN
10.0000 mL | Freq: Once | INTRAMUSCULAR | Status: AC
  Administered 2015-03-08: 1 mL via INTRADERMAL
  Filled 2015-03-08: qty 1

## 2015-03-08 MED ORDER — LIDOCAINE-PRILOCAINE 2.5-2.5 % EX CREA
TOPICAL_CREAM | Freq: Once | CUTANEOUS | Status: DC
  Filled 2015-03-08: qty 5

## 2015-03-08 MED ORDER — SODIUM CHLORIDE 0.9 % IV BOLUS (SEPSIS)
1000.0000 mL | Freq: Once | INTRAVENOUS | Status: AC
  Administered 2015-03-08: 1000 mL via INTRAVENOUS

## 2015-03-08 NOTE — ED Notes (Signed)
PA at bedside.

## 2015-03-08 NOTE — ED Notes (Signed)
Pt presents via POV from [facility] with caregiver c/o right knee pain, swelling, and redness.  Reports he went to be seen by [facility] in [place] this morning and was sent to ER concerned for septic joint.  Hx: cerebral palsy, caregiver at bedside.  Right knee redd and swollen, warm to touch.  Caregiver reports he was unable to sleep last night d/t pain.  Pt continues to grab knee.

## 2015-03-08 NOTE — Consult Note (Signed)
ORTHOPAEDIC CONSULTATION  REQUESTING PHYSICIAN: Gareth Morgan, MD  Chief Complaint: R knee swelling and pain  HPI: Connor Todd is a 69 y.o. male who was taken to the ED today after a caretaker noticed his right knee to be warm and swollen for the past 2 days.  Per the caretaker, he has been guarding the knee and not able to bend it as much.  He has limited ROM of the BLE at baseline and is non-ambulatory due to cerebral palsy, but they feel that his motion is more limited today. The patient also had a low grade temp of 99 this morning.  They any chills or nausea/vomiting.  No recent falls or trauma to the knee.  He has known osteoarthritis of the knee.  No prior surgery.    In the ED, an aspiration was attempted.  No fluid was obtained. WBC was not elevated.  CRP/ESR mildly elevated.  Max temp in ED was 99.5.  Past Medical History  Diagnosis Date  . Reflux   . Thyroid disease   . Development delay   . GERD (gastroesophageal reflux disease)   . Seasonal allergies   . Hypercholesteremia   . Constipation   . Cerebral palsy ([place])   . Mental retardation   . Microcephaly ([place])   . Kyphoscoliosis   . Allergic rhinitis   . Ulcerative esophagitis   . 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 Cystoscopy  11/2009  . Circumcision N/A 11/13/2012    Procedure: CIRCUMCISION ADULT;  Surgeon: Marissa Nestle, MD;  Location: [facility];  Service: Urology;  Laterality: N/A;   Social History   Social History  . Marital Status: Single    Spouse Name: N/A  . Number of Children: N/A  . Years of Education: N/A   Social History Main Topics  . Smoking status: Never Smoker   . Smokeless tobacco: None  . Alcohol Use: No  . Drug Use: No  . Sexual Activity: Not Asked   Other Topics Concern  . None   Social History Narrative   No family history on file. Allergies  Allergen Reactions  . Collard Greens Rosanna Randy Virosa] Anaphylaxis  . Tomato  Anaphylaxis  . Caffeine   . Chocolate   . Milk-Related Compounds     Does ok with cake that has baked milk  . Phenothiazines    Prior to Admission medications   Medication Sig Start Date End Date Taking? Authorizing Provider  Amino Acids-Protein Hydrolys (FEEDING SUPPLEMENT, PRO-STAT SUGAR FREE 64,) LIQD Take 30 mLs by mouth daily. 01/06/14  Yes Serita Grit, MD  cetirizine (ZYRTEC) 10 MG tablet Take 10 mg by mouth daily as needed for allergies.    Yes Historical Provider, MD  ferrous sulfate 325 (65 FE) MG tablet Take 325 mg by mouth daily with breakfast.   Yes Historical Provider, MD  fluticasone (FLONASE) 50 MCG/ACT nasal spray Place 1 spray into both nostrils daily.   Yes Historical Provider, MD  levothyroxine (SYNTHROID, LEVOTHROID) 50 MCG tablet Take 50 mcg by mouth daily.   Yes Historical Provider, MD  LORazepam (ATIVAN) 1 MG tablet Take 0.5-1 mg by mouth 2 (two) times daily as needed for anxiety.    Yes Historical Provider, MD  montelukast (SINGULAIR) 10 MG tablet Take 10 mg by mouth at bedtime.   Yes Historical Provider, MD  Multiple Vitamin (MULTIVITAMIN WITH MINERALS) TABS Take 1 tablet by mouth daily.   Yes Historical  Provider, MD  omeprazole (PRILOSEC) 20 MG capsule Take 20 mg by mouth 2 (two) times daily.    Yes Historical Provider, MD  senna-docusate (SENOKOT-S) 8.6-50 MG tablet Take 1 tablet by mouth daily.   Yes Historical Provider, MD  simvastatin (ZOCOR) 20 MG tablet Take 20 mg by mouth every evening.   Yes Historical Provider, MD  acetaminophen (TYLENOL) 325 MG tablet Take 2 tablets (650 mg total) by mouth every 6 (six) hours as needed. Patient taking differently: Take 650 mg by mouth every 6 (six) hours as needed for mild pain.  09/08/13   Kristen N Ward, DO  cefdinir (OMNICEF) 300 MG capsule Take 1 capsule (300 mg total) by mouth 2 (two) times daily. Patient not taking: Reported on 03/08/2015 06/10/14   Orpah Greek, MD  chlorhexidine (HIBICLENS) 4 % external  liquid Apply 1 application topically daily as needed.    Historical Provider, MD  clindamycin (CLEOCIN) 150 MG capsule Take 2 capsules (300 mg total) by mouth 3 (three) times daily. Patient not taking: Reported on 03/08/2015 01/06/14   Serita Grit, MD  HYDROcodone-acetaminophen (NORCO/VICODIN) 5-325 MG per tablet Take 1 tablet by mouth every 6 (six) hours as needed for moderate pain. Patient not taking: Reported on 03/08/2015 06/10/14   Orpah Greek, MD  loratadine (CLARITIN) 10 MG tablet Take 10 mg by mouth at bedtime.     Historical Provider, MD  senna (SENOKOT) 8.6 MG TABS Take 4 tablets by mouth 2 (two) times daily.     Historical Provider, MD  Sulfamethoxazole-Trimethoprim 800-160 MG/20ML SUSP Take 20 mLs by mouth 2 (two) times daily.    Historical Provider, MD   Dg Knee 1-2 Views Right  03/08/2015  CLINICAL DATA:  Right knee swelling for 2 days. EXAM: RIGHT KNEE - 1-2 VIEW COMPARISON:  None. FINDINGS: Small suprapatellar joint effusion identified. Prepatellar soft tissue swelling is noted. There is thickening of the patellar tendon. Mild tricompartment osteoarthritis noted. No acute fracture or subluxation identified. IMPRESSION: 1. No acute bone abnormality. 2. Small joint effusion and soft tissue thickening. Electronically Signed   By: Kerby Moors M.D.   On: 03/08/2015 13:07    Positive ROS: All other systems have been reviewed and were otherwise negative with the exception of those mentioned in the HPI and as above.  Labs cbc  Recent Labs  03/08/15 1142  WBC 10.0  HGB 13.2  HCT 40.7  PLT 211    Labs inflam  Recent Labs  03/08/15 1142  CRP 5.8*    Labs coag No results for input(s): INR, PTT in the last 72 hours.  Invalid input(s): PT   Recent Labs  03/08/15 1142  NA 140  K 4.2  CL 107  CO2 24  GLUCOSE 113*  BUN 19  CREATININE 0.67  CALCIUM 9.6    Physical Exam: Filed Vitals:   03/08/15 1345 03/08/15 1515  BP: 148/78 128/68  Pulse: 94 97    Temp:    Resp:     General: Alert, no acute distress, limited participation in the exam due to mental retardation Cardiovascular: No pedal edema Respiratory: No cyanosis, no use of accessory musculature GI: No organomegaly, abdomen is soft and non-tender Skin: small quarter sized bruise to the R shin Neurologic: Sensation intact distally Psychiatric: Patient is competent for consent with normal mood and affect Lymphatic: No axillary or cervical lymphadenopathy  MUSCULOSKELETAL:  R knee is mildly swollen.  Slight warmth and erythema noted.  ROM hard to evaluate due to  chronic contracture related to cerebral palsy and guarding due to pain. Sensation intact with 2+ distal pulses.  Other extremities are atraumatic with painless ROM and NVI.  Assessment: R knee swelling and pain  Plan: Patient is difficulty to assess due to cerebral palsy.  WBC is not elevated.  Temp only mildly elevated in the ED today.  Dry tap in ED.  Have low suspicion for septic joint.  Imaging shows tricompartmental arthritis.  No indication for urgent surgical wash out.  Recommend a course of steroids and see if the knee improves.  If the patient starts to show signs of sepsis, then wash out in the OR would be warranted.   [facility] Cell [phone]   03/08/2015 5:41 PM

## 2015-03-08 NOTE — Discharge Instructions (Signed)
We do not think that Connor Todd's knee is infected at this time. Please start the steroid taper and take as directed. Please call Dr. Greig RightMurphy's office to schedule a follow-up appointment. Return to the ER for any new or worsening symptoms.     Please obtain all of your results from medical records or have your doctors office obtain the results - share them with your doctor - you should be seen at your doctors office in the next 2 days. Call today to arrange your follow up. Take the medications as prescribed. Please review all of the medicines and only take them if you do not have an allergy to them. Please be aware that if you are taking birth control pills, taking other prescriptions, ESPECIALLY ANTIBIOTICS may make the birth control ineffective - if this is the case, either do not engage in sexual activity or use alternative methods of birth control such as condoms until you have finished the medicine and your family doctor says it is OK to restart them. If you are on a blood thinner such as COUMADIN, be aware that any other medicine that you take may cause the coumadin to either work too much, or not enough - you should have your coumadin level rechecked in next 7 days if this is the case.  ?  It is also a possibility that you have an allergic reaction to any of the medicines that you have been prescribed - Everybody reacts differently to medications and while MOST people have no trouble with most medicines, you may have a reaction such as nausea, vomiting, rash, swelling, shortness of breath. If this is the case, please stop taking the medicine immediately and contact your physician.  ?  You should return to the ER if you develop severe or worsening symptoms.

## 2015-03-08 NOTE — ED Notes (Signed)
Discharge instructions/prescription reviewed with patient/caregiver. Caregiver verbalizes understanding. No acute distress noted at time of discharge.

## 2015-03-08 NOTE — ED Provider Notes (Addendum)
CSN: [id number]     Arrival date & time 03/08/15  1106 History   First MD Initiated Contact with Patient 03/08/15 1111     Chief Complaint  Patient presents with  . Joint Swelling    HPI  Mr. Monica is an 69 y.o. male with history of cerebral palsy, MR, GERD who presents to the ED for evaluation of swollen R knee. He is a resident of [place] and is accompanied by his caregiver who provides his history as pt is nonverbal. Caregiver reports that pt had a spasm of his R leg yesterday which is normal for him. States that his leg and knee cramped up but it did not straighten out within a few hours like it usually does. Pt's caregiver states that last night he was unable to sleep due to the pain. This morning they noticed the knee was swollen and red, though pt was able to straighten more than yesterday. They brought him to [facility] who sent him to the ED for eval for possible septic joint.  In the ED pt is tachycardic though he is afebrile. R knee is swollen, red, with mild pitting edema. When I ask him to actively straighten his leg unclear if pt won't or can't but he is able to straighten some with passive ROM. Pt's caregiver states that at baseline he cannot straighten his leg 100%.   Past Medical History  Diagnosis Date  . Reflux   . Thyroid disease   . Development delay   . GERD (gastroesophageal reflux disease)   . Seasonal allergies   . Hypercholesteremia   . Constipation   . Cerebral palsy ([place])   . Mental retardation   . Microcephaly ([place])   . Kyphoscoliosis   . Allergic rhinitis   . Ulcerative esophagitis   . 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 Cystoscopy  11/2009  . Circumcision N/A 11/13/2012    Procedure: CIRCUMCISION ADULT;  Surgeon: Marissa Nestle, MD;  Location: [facility];  Service: Urology;  Laterality: N/A;   No family history on file. Social History  Substance Use Topics  . Smoking status: Never Smoker   .  Smokeless tobacco: None  . Alcohol Use: No    Review of Systems  Unable to perform ROS: Patient nonverbal      Allergies  Collard greens; Tomato; Caffeine; Chocolate; Milk-related compounds; and Phenothiazines  Home Medications   Prior to Admission medications   Medication Sig Start Date End Date Taking? Authorizing Provider  Amino Acids-Protein Hydrolys (FEEDING SUPPLEMENT, PRO-STAT SUGAR FREE 64,) LIQD Take 30 mLs by mouth daily. 01/06/14  Yes Serita Grit, MD  cetirizine (ZYRTEC) 10 MG tablet Take 10 mg by mouth daily as needed for allergies.    Yes Historical Provider, MD  ferrous sulfate 325 (65 FE) MG tablet Take 325 mg by mouth daily with breakfast.   Yes Historical Provider, MD  fluticasone (FLONASE) 50 MCG/ACT nasal spray Place 1 spray into both nostrils daily.   Yes Historical Provider, MD  levothyroxine (SYNTHROID, LEVOTHROID) 50 MCG tablet Take 50 mcg by mouth daily.   Yes Historical Provider, MD  LORazepam (ATIVAN) 1 MG tablet Take 0.5-1 mg by mouth 2 (two) times daily as needed for anxiety.    Yes Historical Provider, MD  montelukast (SINGULAIR) 10 MG tablet Take 10 mg by mouth at bedtime.   Yes Historical Provider, MD  Multiple Vitamin (MULTIVITAMIN WITH MINERALS) TABS Take 1 tablet by mouth  daily.   Yes Historical Provider, MD  omeprazole (PRILOSEC) 20 MG capsule Take 20 mg by mouth 2 (two) times daily.    Yes Historical Provider, MD  senna-docusate (SENOKOT-S) 8.6-50 MG tablet Take 1 tablet by mouth daily.   Yes Historical Provider, MD  simvastatin (ZOCOR) 20 MG tablet Take 20 mg by mouth every evening.   Yes Historical Provider, MD  acetaminophen (TYLENOL) 325 MG tablet Take 2 tablets (650 mg total) by mouth every 6 (six) hours as needed. Patient taking differently: Take 650 mg by mouth every 6 (six) hours as needed for mild pain.  09/08/13   Kristen N Ward, DO  cefdinir (OMNICEF) 300 MG capsule Take 1 capsule (300 mg total) by mouth 2 (two) times daily. Patient not  taking: Reported on 03/08/2015 06/10/14   Orpah Greek, MD  chlorhexidine (HIBICLENS) 4 % external liquid Apply 1 application topically daily as needed.    Historical Provider, MD  clindamycin (CLEOCIN) 150 MG capsule Take 2 capsules (300 mg total) by mouth 3 (three) times daily. Patient not taking: Reported on 03/08/2015 01/06/14   Serita Grit, MD  HYDROcodone-acetaminophen (NORCO/VICODIN) 5-325 MG per tablet Take 1 tablet by mouth every 6 (six) hours as needed for moderate pain. Patient not taking: Reported on 03/08/2015 06/10/14   Orpah Greek, MD  loratadine (CLARITIN) 10 MG tablet Take 10 mg by mouth at bedtime.     Historical Provider, MD  senna (SENOKOT) 8.6 MG TABS Take 4 tablets by mouth 2 (two) times daily.     Historical Provider, MD  Sulfamethoxazole-Trimethoprim 800-160 MG/20ML SUSP Take 20 mLs by mouth 2 (two) times daily.    Historical Provider, MD   BP 136/81 mmHg  Pulse 105  Temp(Src) 99.5 F (37.5 C) (Rectal)  Resp 16  SpO2 97% Physical Exam  Constitutional:  Pt appears uncomfortable (having blood drawn), but NAD  HENT:  Head: Normocephalic and atraumatic.  Left Ear: External ear normal.  Nose: Nose normal.  Mouth/Throat: Oropharynx is clear and moist.  Eyes: Conjunctivae and EOM are normal. Pupils are equal, round, and reactive to light.  Neck: Normal range of motion. Neck supple. No tracheal deviation present.  Cardiovascular: Regular rhythm and normal heart sounds.  Tachycardia present.   Pulmonary/Chest: Effort normal and breath sounds normal. No stridor. No respiratory distress.  Abdominal: Soft. Bowel sounds are normal. He exhibits no distension. There is no guarding.  Musculoskeletal:  R knee is swollen, erythematous, and hot to touch. Unable to extend leg fully even with my guidance. Pulses intact.   Skin: He is not diaphoretic.  Nursing note and vitals reviewed.   ED Course  .Joint Aspiration/Arthrocentesis Date/Time: 03/09/2015 9:30  PM Performed by: Gareth Morgan Authorized by: Gareth Morgan Consent: Verbal consent obtained. Risks and benefits: risks, benefits and alternatives were discussed Consent given by: guardian (pt ward of the state, spoke with guardian on phone) Required items: required blood products, implants, devices, and special equipment available Patient identity confirmed: arm band Time out: Immediately prior to procedure a "time out" was called to verify the correct patient, procedure, equipment, support staff and site/side marked as required. Indications: pain,  possible septic joint,  diagnostic evaluation and joint swelling  Body area: knee Joint: right knee Local anesthesia used: yes Local anesthetic: lidocaine 1% with epinephrine Preparation: Patient was prepped and draped in the usual sterile fashion. Needle gauge: 20 G Ultrasound guidance: no Approach: medial Aspirate: bloody Aspirate amount: 0 mL Patient tolerance: Patient tolerated the procedure well  with no immediate complications Comments: Attempted 3 aspirations and unable to obtain fluid Patient tolerated well Entered medially and superomedially (in area with no erythema) Performed by Dr. Billy Fischer   (including critical care time) Labs Review Labs Reviewed  BASIC METABOLIC PANEL - Abnormal; Notable for the following:    Glucose, Bld 113 (*)    All other components within normal limits  CBC WITH DIFFERENTIAL/PLATELET - Abnormal; Notable for the following:    RDW 15.8 (*)    Monocytes Absolute 1.7 (*)    All other components within normal limits  SEDIMENTATION RATE - Abnormal; Notable for the following:    Sed Rate 72 (*)    All other components within normal limits  C-REACTIVE PROTEIN - Abnormal; Notable for the following:    CRP 5.8 (*)    All other components within normal limits  I-STAT CG4 LACTIC ACID, ED - Abnormal; Notable for the following:    Lactic Acid, Venous 2.04 (*)    All other components within normal  limits  CULTURE, BLOOD (ROUTINE X 2)  BODY FLUID CULTURE  GRAM STAIN  CULTURE, BLOOD (ROUTINE X 2)  SYNOVIAL CELL COUNT + DIFF, W/ CRYSTALS  GLUCOSE, SYNOVIAL FLUID  I-STAT CG4 LACTIC ACID, ED    Imaging Review Dg Knee 1-2 Views Right  03/08/2015  CLINICAL DATA:  Right knee swelling for 2 days. EXAM: RIGHT KNEE - 1-2 VIEW COMPARISON:  None. FINDINGS: Small suprapatellar joint effusion identified. Prepatellar soft tissue swelling is noted. There is thickening of the patellar tendon. Mild tricompartment osteoarthritis noted. No acute fracture or subluxation identified. IMPRESSION: 1. No acute bone abnormality. 2. Small joint effusion and soft tissue thickening. Electronically Signed   By: Kerby Moors M.D.   On: 03/08/2015 13:07   I have personally reviewed and evaluated these images and lab results as part of my medical decision-making.   EKG Interpretation None      MDM   Final diagnoses:  Right knee pain  Knee effusion, right    Concern for septic joint. Pt is tachycardic, lactic acid 2.04, CRP 5.8, ESR 72. Dr. Billy Fischer to tap joint. Blood cultures drawn. In and out cath/UA pending.   Unable to get a good tap. Paged ortho for consult.   Spoke to Dr. Percell Miller, will page IR to tap joint. If cell count is >20k will wash out tonight.   After back and forth w/ ortho and radiology, Dr. Percell Miller saw pt. Will not tap joint today. Pt is afebrile, no white count. He has low suspicion for septic joint. Will give prednisone taper and f/u with ortho as an outpatient. ER return precautions given.  Anne Ng, PA-C 03/08/15 [place], MD 03/10/15 2129  Gareth Morgan, MD 03/10/15 2132

## 2015-03-08 NOTE — ED Notes (Signed)
Patient to be discharged. Caregiver reports that she will transport patient in wheelchair accessible Connor Todd back to his apartment.

## 2015-03-13 LAB — CULTURE, BLOOD (ROUTINE X 2)
CULTURE: NO GROWTH
CULTURE: NO GROWTH

## 2016-01-10 IMAGING — US US EXTREM LOW*R* LIMITED
1 series · 5 of 5 positions shown · non-contrast
Comparison: None

CLINICAL DATA: Pain and swelling right buttock region. Rule out
abscess

EXAM:
ULTRASOUND right LOWER EXTREMITY LIMITED
TECHNIQUE: Ultrasound examination of the lower extremity soft tissues was
performed in the area of clinical concern.

[Series 1: us extrem low*right* limited · 0.06mm/px · 5 of 5 slices shown]
[im 1/5]
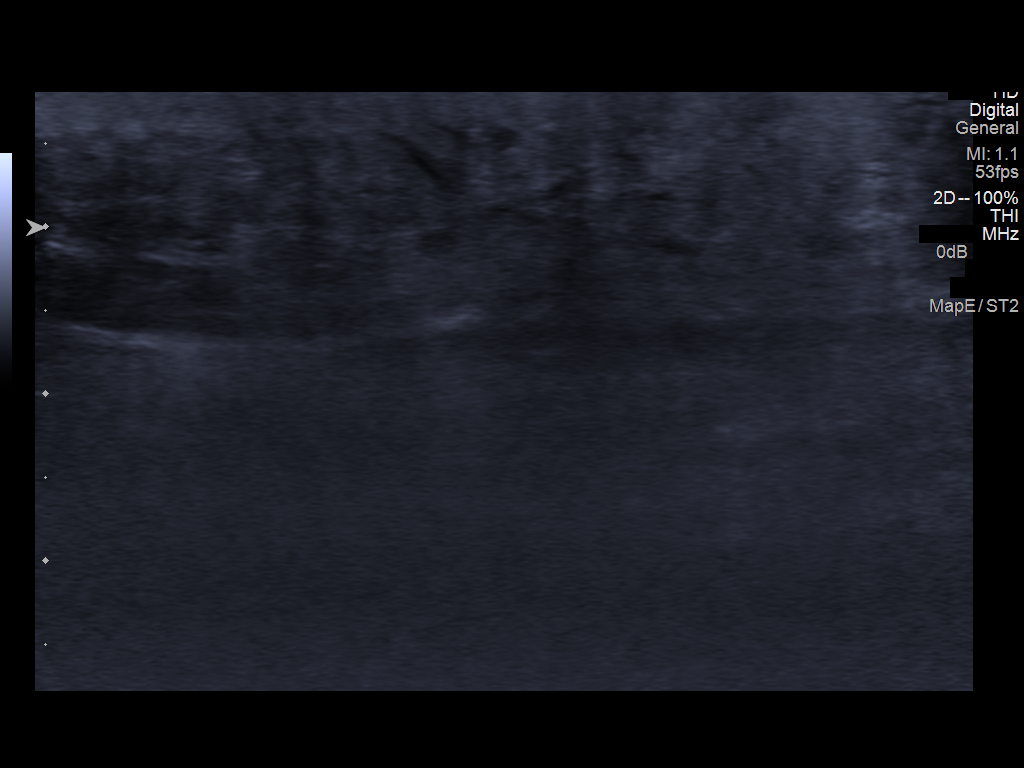
[im 2/5]
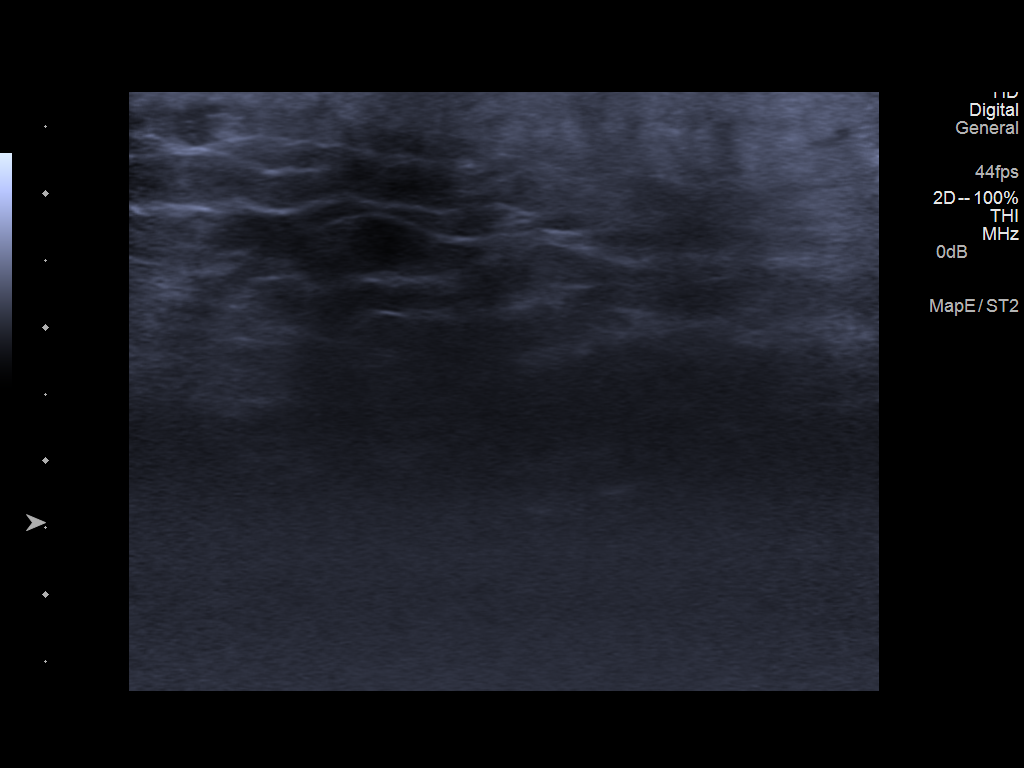
[im 3/5]
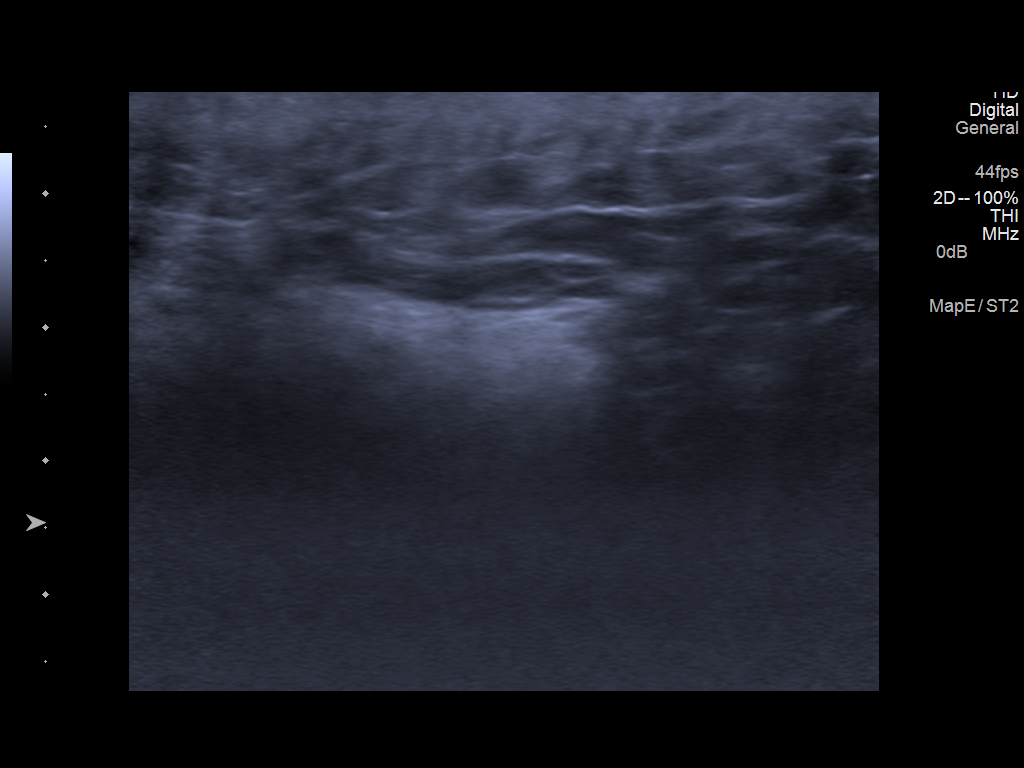
[im 4/5]
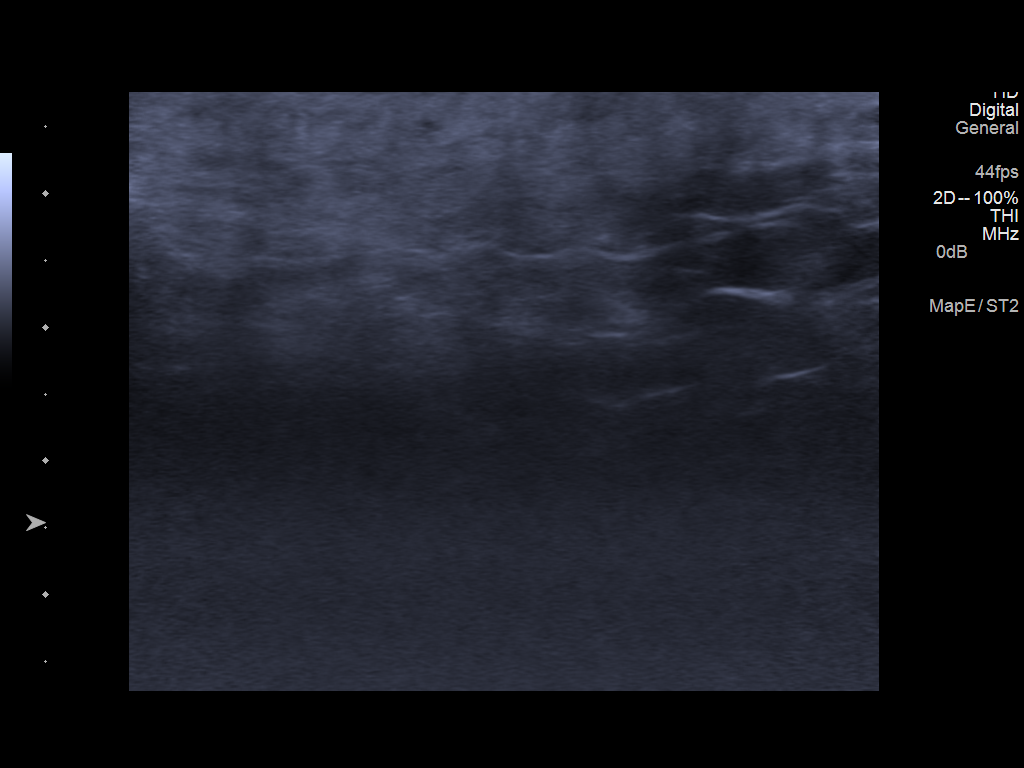
[im 5/5]
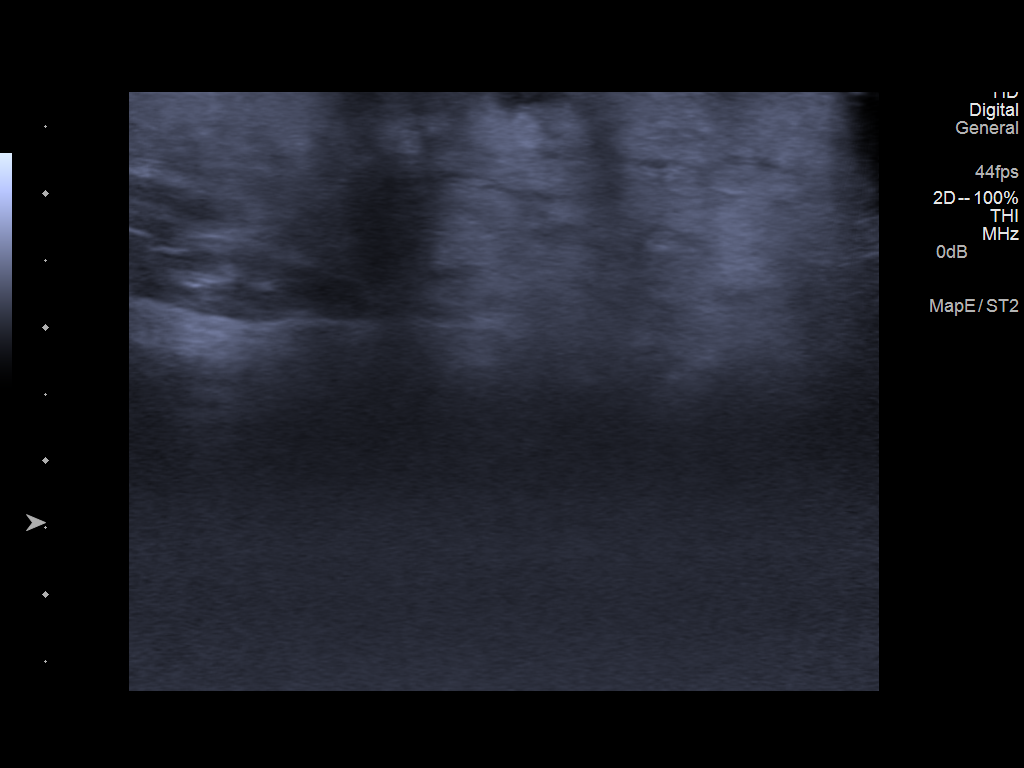

[5 of 5 positions shown; findings below may reference images not displayed]

FINDINGS: Scanning was limited to the right buttock region. Negative for
abscess. No mass or fluid collection is identified.
IMPRESSION: Negative for abscess in the right buttock.

## 2016-12-23 IMAGING — US US ART/VEN ABD/PELV/SCROTUM DOPPLER LTD
1 series · 13 of 25 positions shown · non-contrast
Comparison: None.

CLINICAL DATA: Right scrotal pain and swelling for 1 day

EXAM:
SCROTAL ULTRASOUND
DOPPLER ULTRASOUND OF THE TESTICLES
TECHNIQUE: Complete ultrasound examination of the testicles, epididymis, and
other scrotal structures was performed. Color and spectral Doppler
ultrasound were also utilized to evaluate blood flow to the
testicles.

[Series 1: us art/ven abd/pelv/scrotum doppler ltd · 0.07mm/px · 13 of 50 slices shown]
[im 1/50]
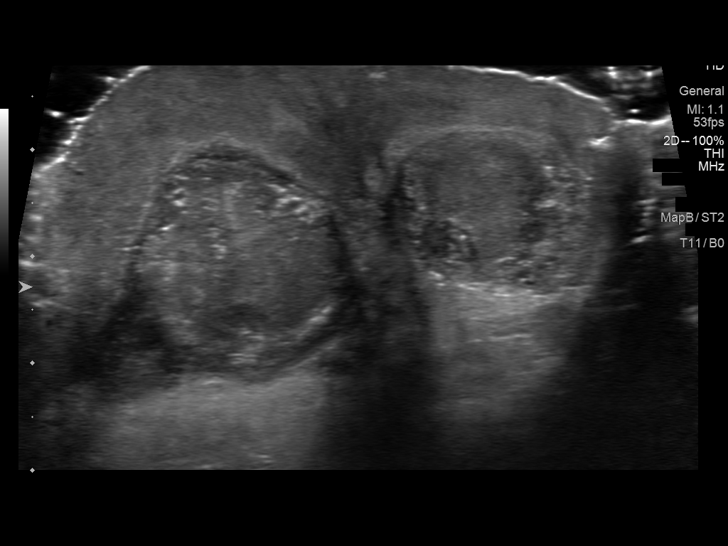
[im 5/50]
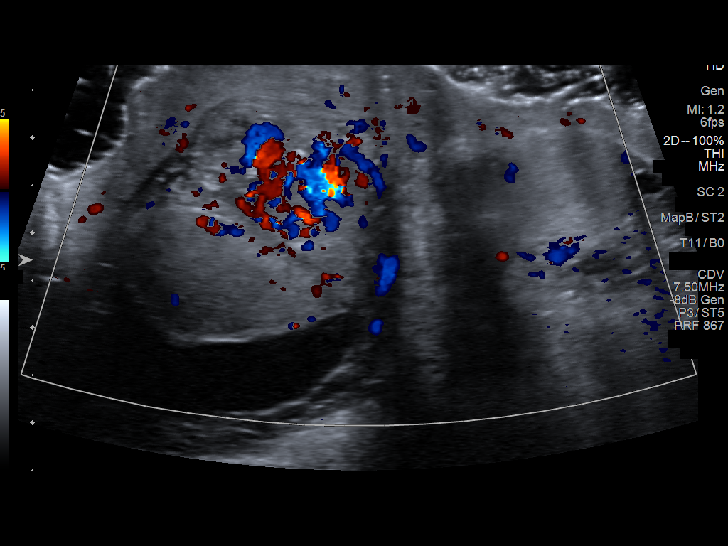
[im 9/50]
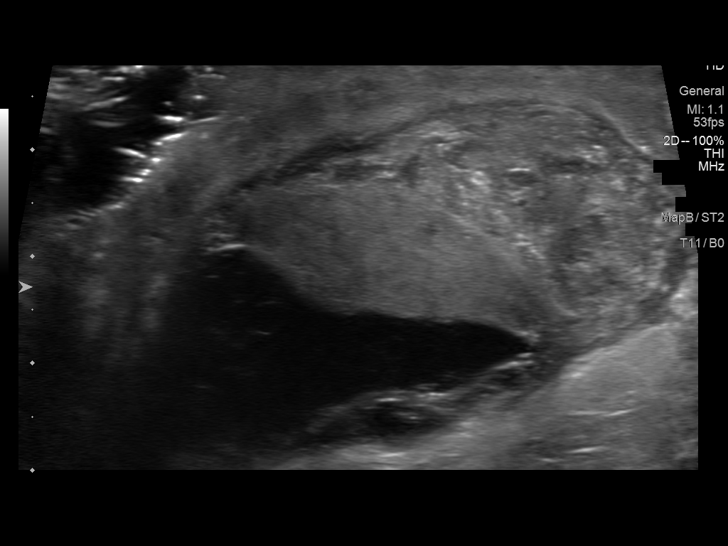
[im 13/50]
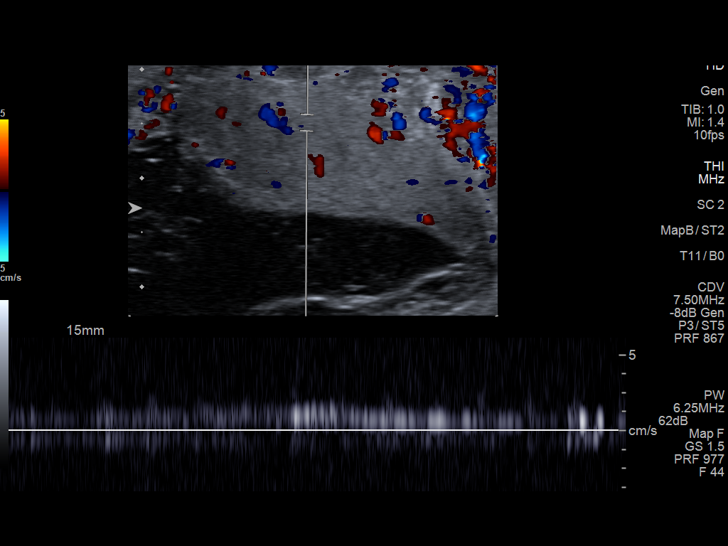
[im 17/50]
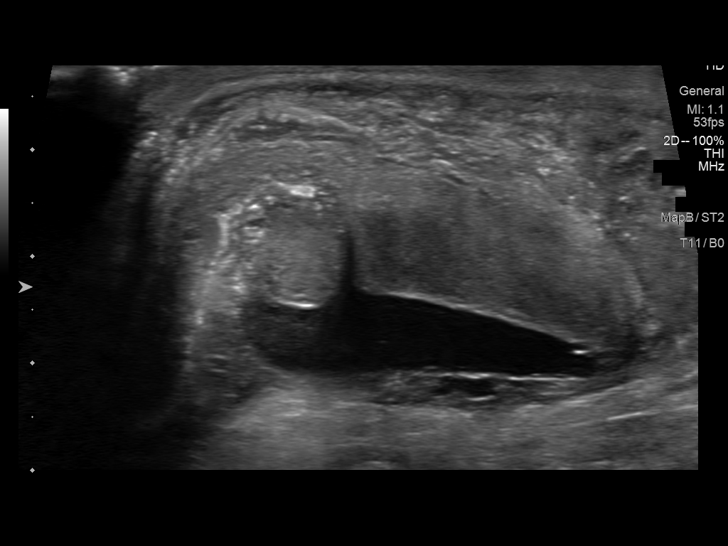
[im 21/50]
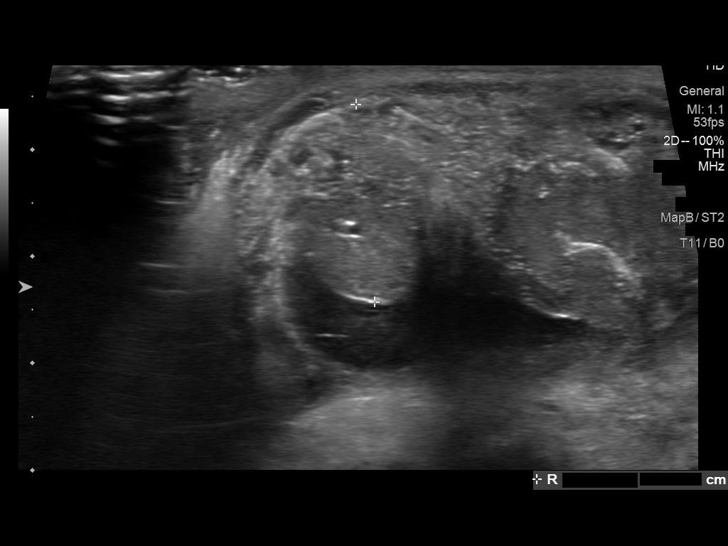
[im 25/50]
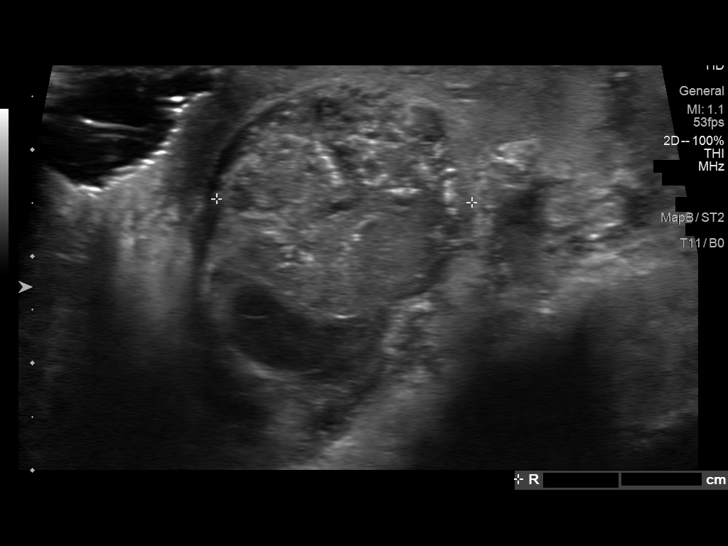
[im 29/50]
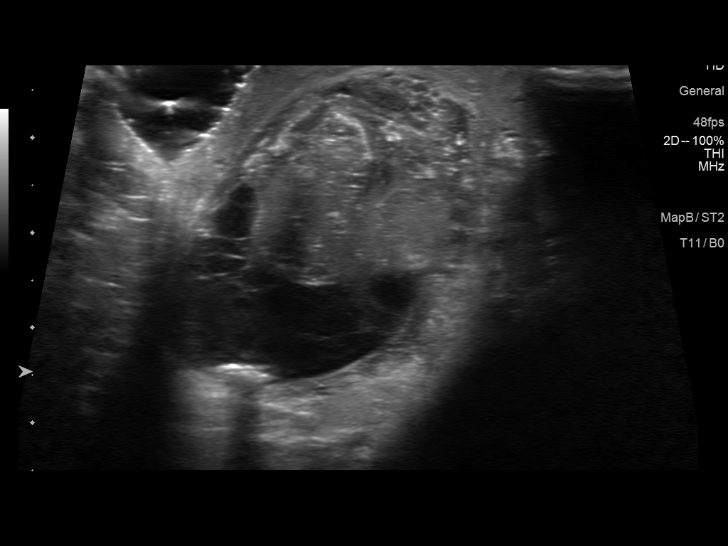
[im 33/50]
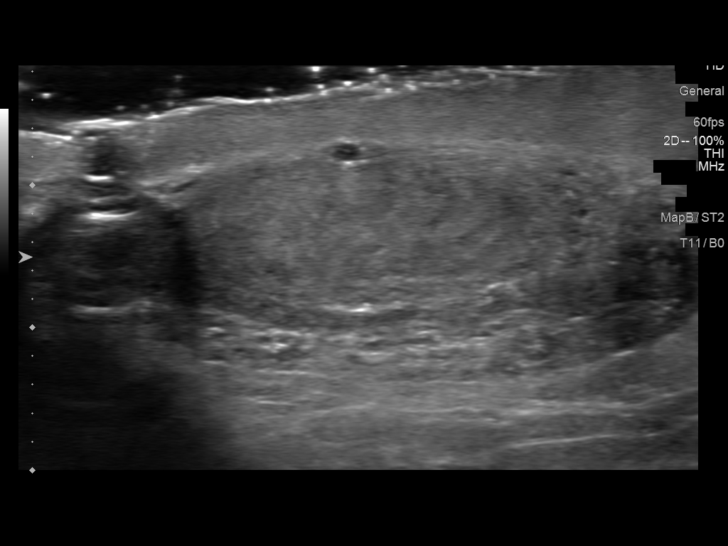
[im 37/50]
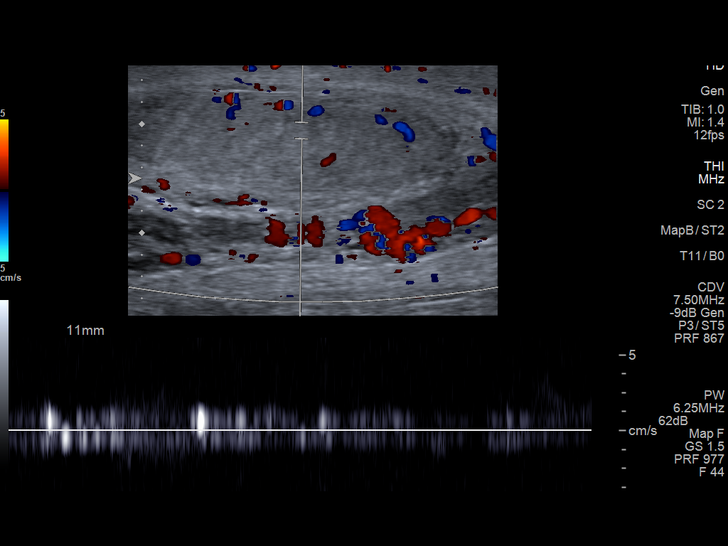
[im 41/50]
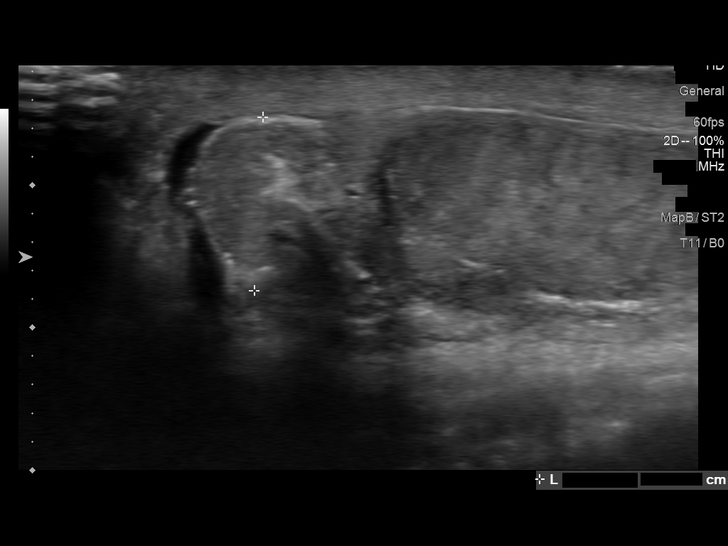
[im 45/50]
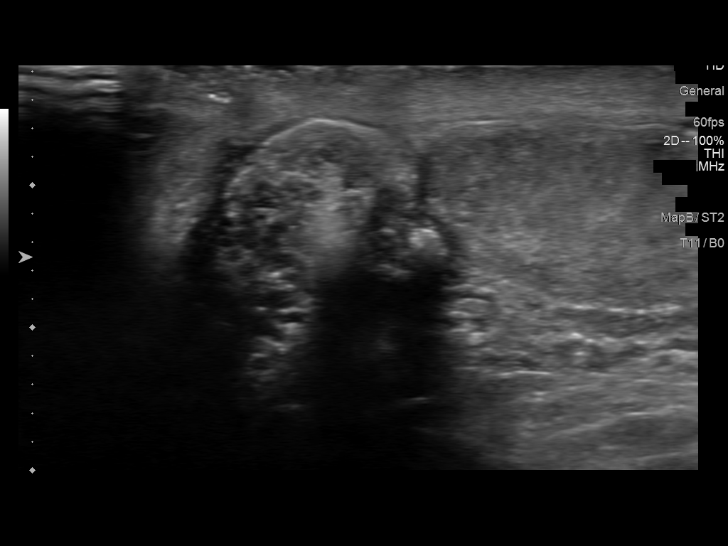
[im 50/50]
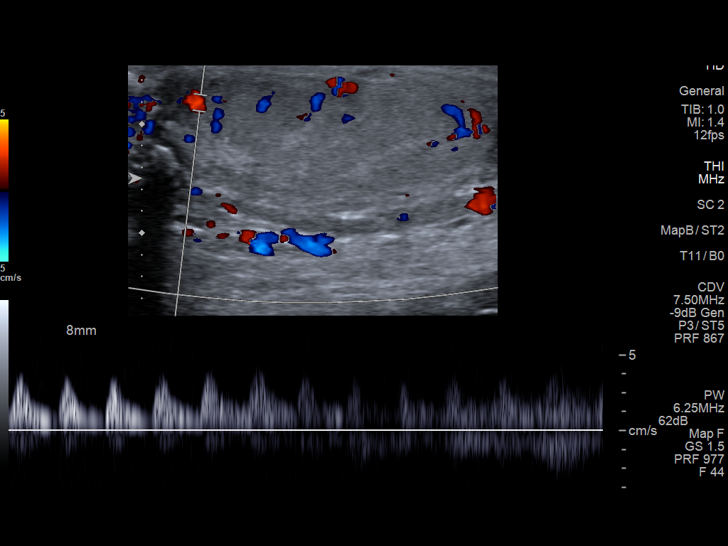

[13 of 25 positions shown; findings below may reference images not displayed]

FINDINGS: Right testicle

Measurements: 3.3 x 1.5 x 1.6 cm. No mass or microlithiasis
visualized. The echotexture of the testis is mildly heterogeneous.

Left testicle

Measurements: 3.2 x 1.0 x 1.4 cm. No mass or microlithiasis
visualized. The echotexture of the testis is mildly heterogeneous.

Right epididymis: The right epididymis is diffusely enlarged an
extremely hypervascular. There is no well-defined mass in this area.

Left epididymis: The left epididymis is moderately enlarged and
diffusely hypervascular. There is no well-defined mass in this area.

Hydrocele: There is a septated hydrocele on the right, moderate.
There is no appreciable left-sided hydrocele.

Varicocele:  None visualized.

Pulsed Doppler interrogation of both testes demonstrates normal low
resistance arterial and venous waveforms bilaterally. The right
testis is hypervascular. The peak systolic velocity on the right is
7 cm/sec. On the left, the peak systolic velocity is 4 cm/sec.

There is no frank abscess seen. There is mild scrotal wall
thickening diffusely.
IMPRESSION: Findings consistent with epididymitis and orchitis bilaterally, more
severe on the right than on the left. Both epididymal structures are
diffusely enlarged and markedly hypervascular. The testes are
somewhat less dramatically hypervascular. There is a complex
hydrocele on the right, felt to be of inflammatory etiology. Mild
scrotal wall thickening is likewise felt to be due to inflammatory
etiology. There is no intratesticular mass or torsion seen.

## 2017-09-20 IMAGING — CR DG KNEE 1-2V*R*
2 series · 2 of 2 positions shown · non-contrast
Comparison: None.

CLINICAL DATA: Right knee swelling for 2 days.

EXAM:
RIGHT KNEE - 1-2 VIEW

[knee ap]
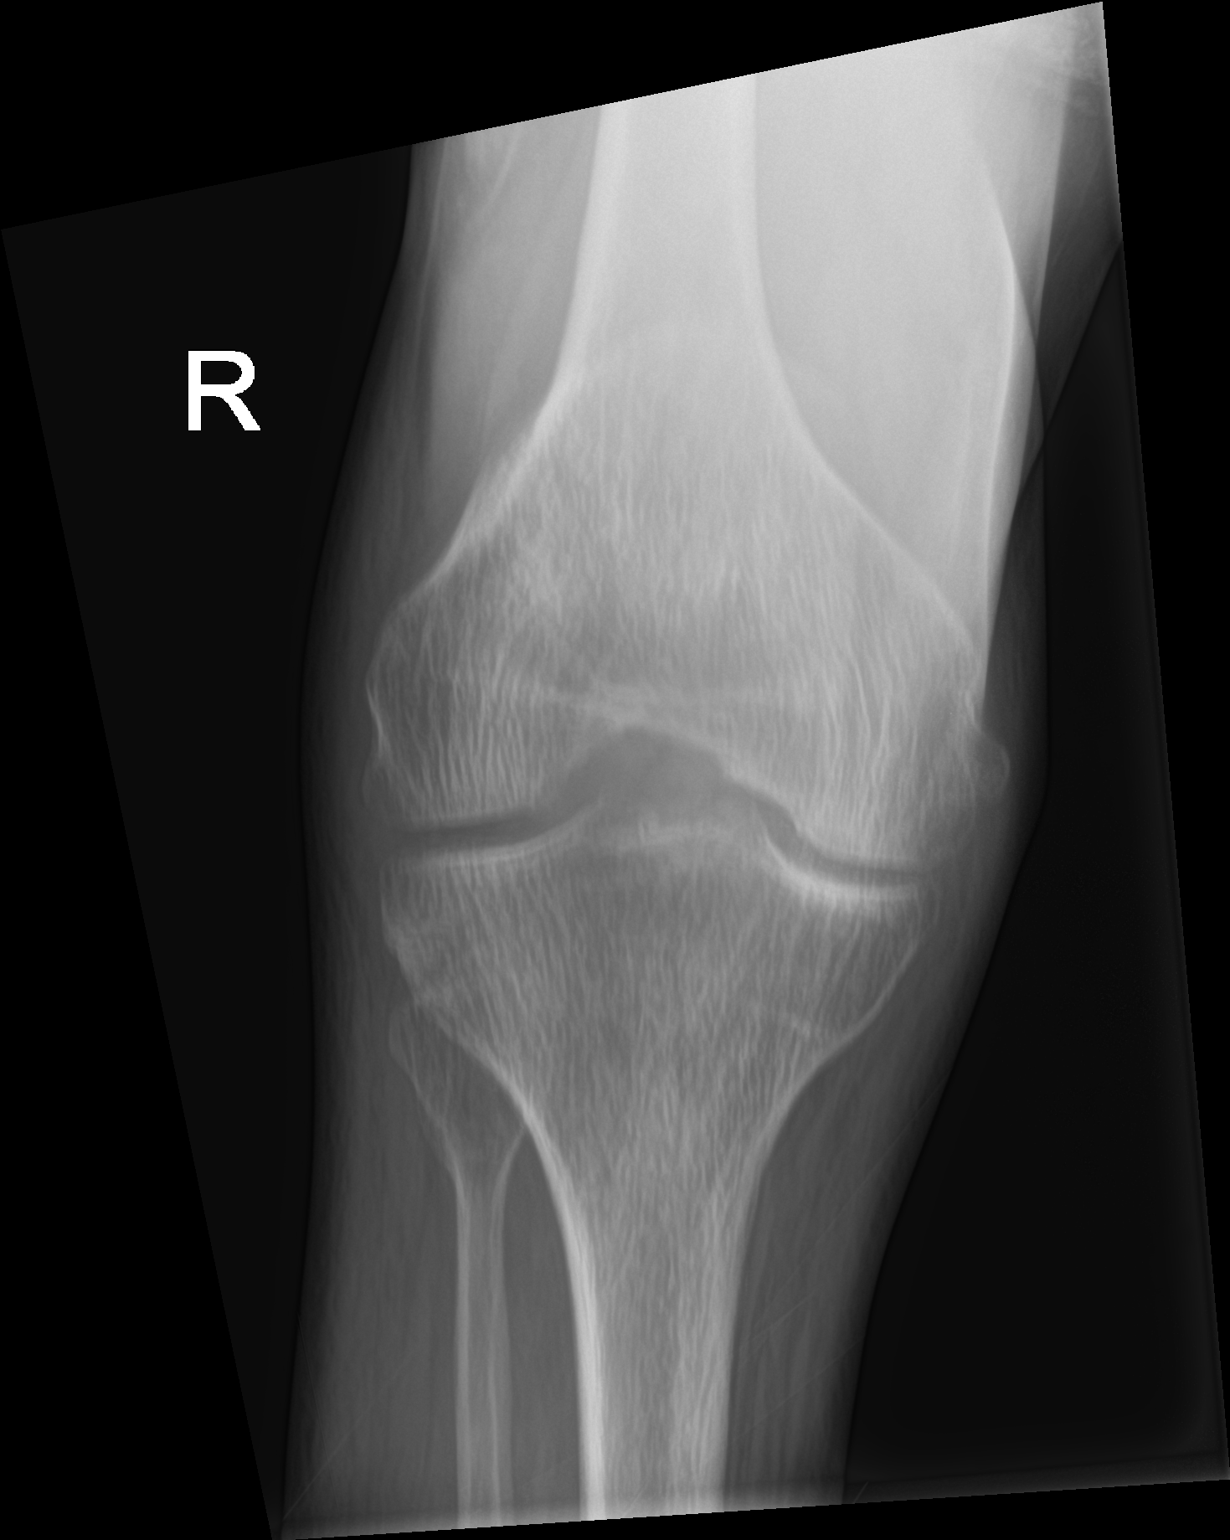

[knee lat]
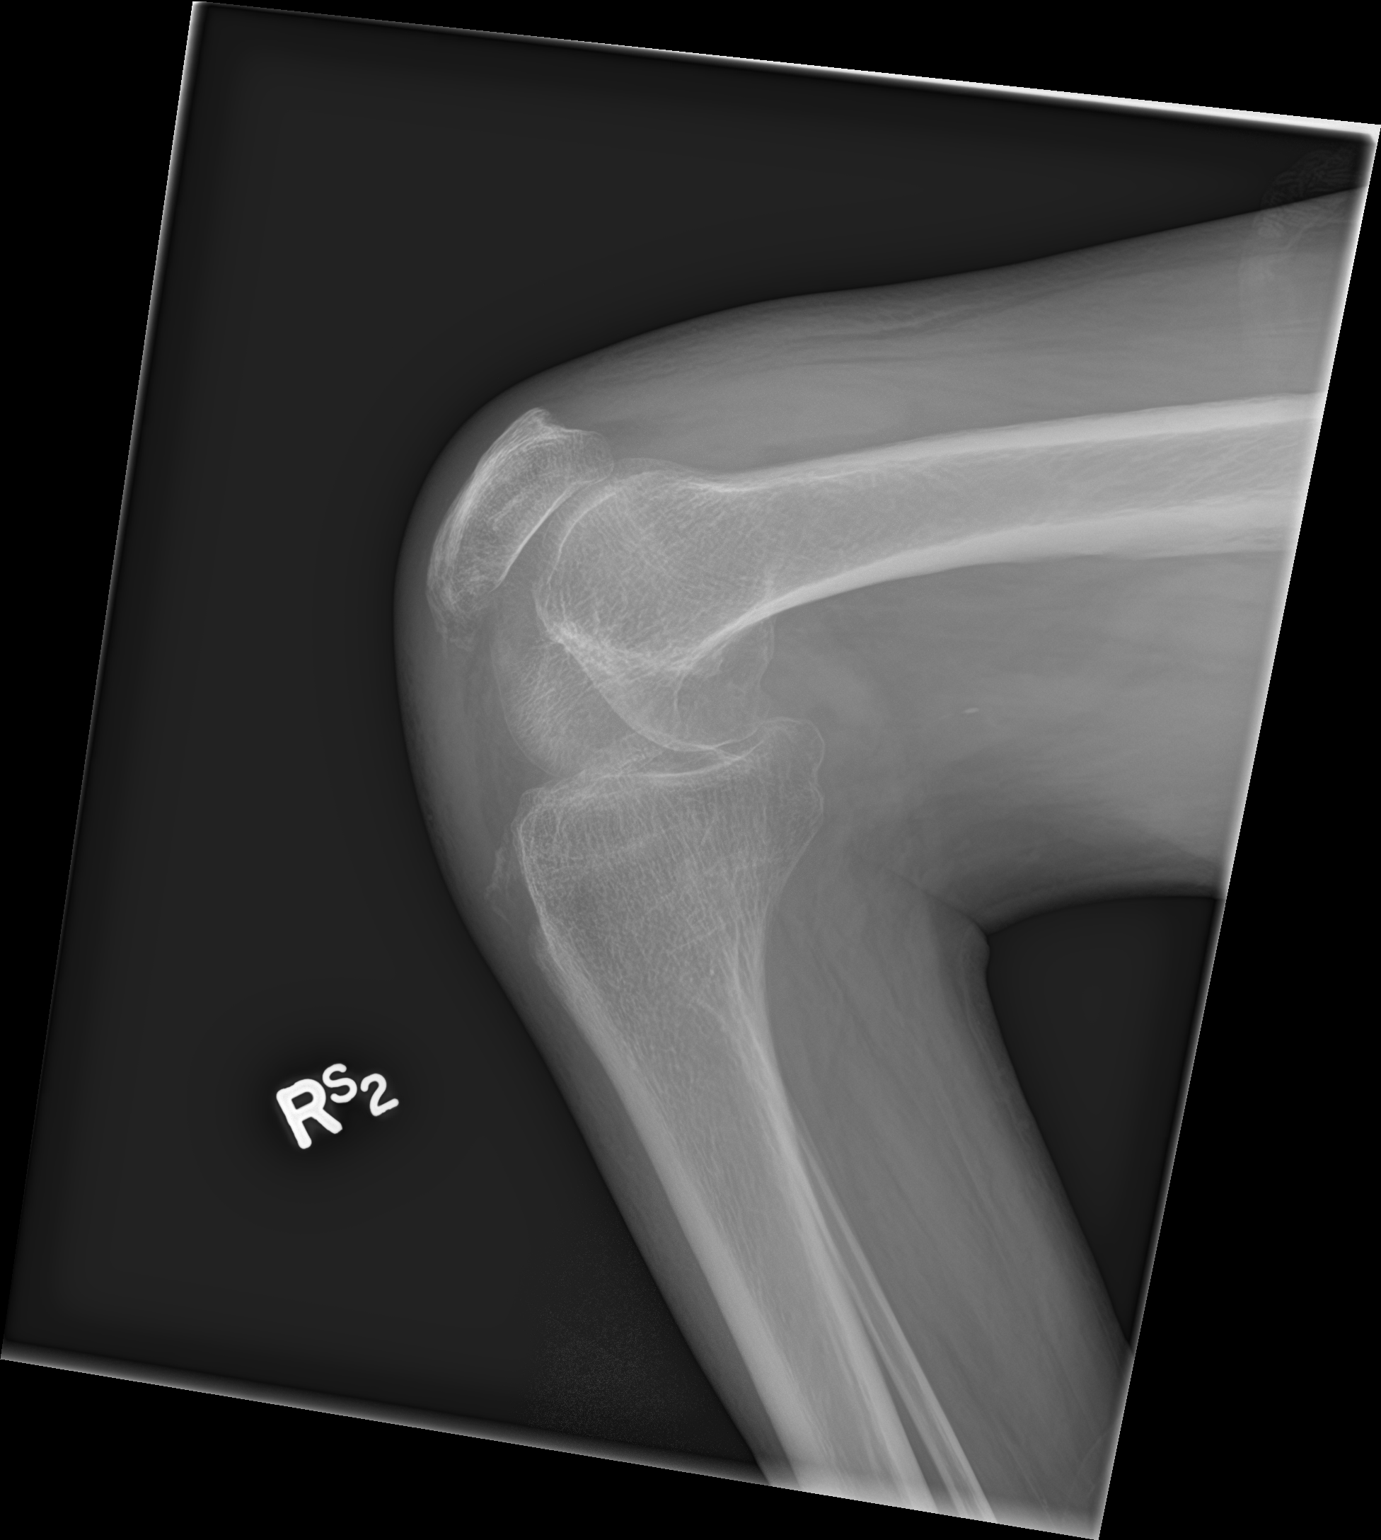

[2 of 2 positions shown; findings below may reference images not displayed]

FINDINGS: Small suprapatellar joint effusion identified. Prepatellar soft
tissue swelling is noted. There is thickening of the patellar
tendon. Mild tricompartment osteoarthritis noted. No acute fracture
or subluxation identified.
IMPRESSION: 1. No acute bone abnormality.
2. Small joint effusion and soft tissue thickening.
